# Patient Record
Sex: Female | Born: 1975 | Race: White | Hispanic: No | Marital: Single | Smoking: Current every day smoker
Health system: Southern US, Community
[De-identification: ages and names within clinical notes are randomized; demographics above are authoritative.]

## PROBLEM LIST (undated history)

## (undated) DIAGNOSIS — I10 Essential (primary) hypertension: Secondary | ICD-10-CM

## (undated) DIAGNOSIS — R87619 Unspecified abnormal cytological findings in specimens from cervix uteri: Secondary | ICD-10-CM

## (undated) DIAGNOSIS — N83202 Unspecified ovarian cyst, left side: Secondary | ICD-10-CM

## (undated) DIAGNOSIS — IMO0002 Reserved for concepts with insufficient information to code with codable children: Secondary | ICD-10-CM

## (undated) DIAGNOSIS — Z8719 Personal history of other diseases of the digestive system: Secondary | ICD-10-CM

## (undated) DIAGNOSIS — N83201 Unspecified ovarian cyst, right side: Secondary | ICD-10-CM

## (undated) DIAGNOSIS — Z8711 Personal history of peptic ulcer disease: Secondary | ICD-10-CM

## (undated) DIAGNOSIS — F319 Bipolar disorder, unspecified: Secondary | ICD-10-CM

## (undated) HISTORY — PX: TUBAL LIGATION: SHX77

## (undated) HISTORY — PX: CHOLECYSTECTOMY: SHX55

---

## 2007-04-07 ENCOUNTER — Emergency Department (HOSPITAL_COMMUNITY): Admission: EM | Admit: 2007-04-07 | Discharge: 2007-04-07 | Payer: Self-pay | Admitting: Emergency Medicine

## 2010-12-19 LAB — I-STAT 8, (EC8 V) (CONVERTED LAB)
Acid-base deficit: 1
TCO2: 23
pCO2, Ven: 30.5 — ABNORMAL LOW
pH, Ven: 7.468 — ABNORMAL HIGH

## 2010-12-19 LAB — DIFFERENTIAL
Basophils Absolute: 0
Eosinophils Absolute: 1.1 — ABNORMAL HIGH
Lymphocytes Relative: 20
Neutrophils Relative %: 67

## 2010-12-19 LAB — WOUND CULTURE: Gram Stain: NONE SEEN

## 2010-12-19 LAB — POCT I-STAT CREATININE
Creatinine, Ser: 0.7
Operator id: 257131

## 2010-12-19 LAB — CBC
MCHC: 33.6
RDW: 13.2

## 2011-08-20 ENCOUNTER — Encounter (HOSPITAL_COMMUNITY): Payer: Self-pay | Admitting: Emergency Medicine

## 2011-08-20 ENCOUNTER — Emergency Department (HOSPITAL_COMMUNITY)
Admission: EM | Admit: 2011-08-20 | Discharge: 2011-08-20 | Disposition: A | Discharge status: Home / Self Care | Payer: Self-pay | Attending: Emergency Medicine | Admitting: Emergency Medicine

## 2011-08-20 DIAGNOSIS — IMO0002 Reserved for concepts with insufficient information to code with codable children: Secondary | ICD-10-CM | POA: Insufficient documentation

## 2011-08-20 DIAGNOSIS — F17200 Nicotine dependence, unspecified, uncomplicated: Secondary | ICD-10-CM | POA: Insufficient documentation

## 2011-08-20 DIAGNOSIS — L0291 Cutaneous abscess, unspecified: Secondary | ICD-10-CM

## 2011-08-20 DIAGNOSIS — I10 Essential (primary) hypertension: Secondary | ICD-10-CM | POA: Insufficient documentation

## 2011-08-20 HISTORY — DX: Essential (primary) hypertension: I10

## 2011-08-20 MED ORDER — SULFAMETHOXAZOLE-TRIMETHOPRIM 800-160 MG PO TABS: 1.0000 | ORAL_TABLET | Freq: Two times a day (BID) | ORAL | Status: AC

## 2011-08-20 MED ORDER — HYDROCODONE-ACETAMINOPHEN 5-325 MG PO TABS: 1.0000 | ORAL_TABLET | ORAL | Status: AC | PRN

## 2011-08-20 MED ORDER — OXYCODONE-ACETAMINOPHEN 5-325 MG PO TABS
1.0000 | ORAL_TABLET | Freq: Once | ORAL | Status: AC
  Administered 2011-08-20: 1 via ORAL
  Filled 2011-08-20: qty 1

## 2011-08-20 NOTE — ED Notes (Signed)
Patient is AOx4 and comfortable with her discharge instructions.  Patient has a ride home. 

## 2011-08-20 NOTE — ED Provider Notes (Signed)
History     CSN: [id number]  Arrival date & time 08/20/11  6440   First MD Initiated Contact with Patient 08/20/11 2005      Chief Complaint  Patient presents with  . Recurrent Skin Infections  . Shoulder Pain    (Consider location/radiation/quality/duration/timing/severity/associated sxs/prior treatment) Patient is a 36 y.o. female presenting with abscess. The history is provided by the patient.  Abscess  This is a new problem. The current episode started yesterday. The problem has been gradually worsening. Pertinent negatives include no fever. Associated symptoms comments: Painful swelling posterior right shoulder since yesterday. Some drainage this morning. No fever. No history of abscesses in the past..    Past Medical History  Diagnosis Date  . Hypertension     History reviewed. No pertinent past surgical history.  History reviewed. No pertinent family history.  History  Substance Use Topics  . Smoking status: Current Everyday Smoker -- 1.0 packs/day  . Smokeless tobacco: Not on file  . Alcohol Use: No    OB History    Grav Para Term Preterm Abortions TAB SAB Ect Mult Living                  Review of Systems  Constitutional: Negative for fever.  Gastrointestinal: Negative for nausea.  Skin:       See HPI.  Neurological: Negative.     Allergies  Review of patient's allergies indicates no known allergies.  Home Medications  No current outpatient prescriptions on file.  BP 143/106  Pulse 106  Temp(Src) 98.8 F (37.1 C) (Oral)  Resp 20  SpO2 99%  LMP 08/18/2011  Physical Exam  Constitutional: She appears well-developed and well-nourished.  Pulmonary/Chest: Effort normal.  Skin:       Swollen area to posterior right shoulder that is tender with scab present. No active drainage. Minimal induration and no fluctuance.     ED Course  Procedures (including critical care time)  Labs Reviewed - No data to display No results found. INCISION AND  DRAINAGE Performed by: Langley Adie A Consent: Verbal consent obtained. Risks and benefits: risks, benefits and alternatives were discussed Type: abscess  Body area: post. Rt. shoulder  Anesthesia: local infiltration  Local anesthetic: lidocaine 1% w/ epinephrine  Anesthetic total: 1 ml  Complexity: complex Blunt dissection to break up loculations  Drainage: purulent  Drainage amount: small  Packing material: 1/4 in iodoform gauze  Patient tolerance: Patient tolerated the procedure well with no immediate complications.     No diagnosis found. 1. Abscess    MDM  Suspect sebaceous cyst abscess with minimal drainage and small cavity. No packing used. Abx and pain medication given.         Rodena Medin, PA-C 08/20/11 2056

## 2011-08-20 NOTE — ED Provider Notes (Signed)
Medical screening examination/treatment/procedure(s) were performed by non-physician practitioner and as supervising physician I was immediately available for consultation/collaboration.   Eligah Anello, MD 08/20/11 2318 

## 2011-08-20 NOTE — ED Notes (Signed)
Patient complaining of a boil on her right shoulder that started yesterday afternoon; patient states that the boil causes pain all the way down her right arm, her neck, and her back.  Patient able to move all extremities without difficulty.  Scab noted over boil.

## 2011-08-20 NOTE — Discharge Instructions (Signed)
Abscess An abscess (boil or furuncle) is an infected area that contains a collection of pus.  SYMPTOMS Signs and symptoms of an abscess include pain, tenderness, redness, or hardness. You may feel a moveable soft area under your skin. An abscess can occur anywhere in the body.  TREATMENT  A surgical cut (incision) may be made over your abscess to drain the pus. Gauze may be packed into the space or a drain may be looped through the abscess cavity (pocket). This provides a drain that will allow the cavity to heal from the inside outwards. The abscess may be painful for a few days, but should feel much better if it was drained.  Your abscess, if seen early, may not have localized and may not have been drained. If not, another appointment may be required if it does not get better on its own or with medications. HOME CARE INSTRUCTIONS   Only take over-the-counter or prescription medicines for pain, discomfort, or fever as directed by your caregiver.   Take your antibiotics as directed if they were prescribed. Finish them even if you start to feel better.   Keep the skin and clothes clean around your abscess.   If the abscess was drained, you will need to use gauze dressing to collect any draining pus. Dressings will typically need to be changed 3 or more times a day.   The infection may spread by skin contact with others. Avoid skin contact as much as possible.   Practice good hygiene. This includes regular hand washing, cover any draining skin lesions, and do not share personal care items.   If you participate in sports, do not share athletic equipment, towels, whirlpools, or personal care items. Shower after every practice or tournament.   If a draining area cannot be adequately covered:   Do not participate in sports.   Children should not participate in day care until the wound has healed or drainage stops.   If your caregiver has given you a follow-up appointment, it is very important  to keep that appointment. Not keeping the appointment could result in a much worse infection, chronic or permanent injury, pain, and disability. If there is any problem keeping the appointment, you must call back to this facility for assistance.  SEEK MEDICAL CARE IF:   You develop increased pain, swelling, redness, drainage, or bleeding in the wound site.   You develop signs of generalized infection including muscle aches, chills, fever, or a general ill feeling.   You have an oral temperature above 102 F (38.9 C).  MAKE SURE YOU:   Understand these instructions.   Will watch your condition.   Will get help right away if you are not doing well or get worse.  Document Released: 12/25/2004 Document Revised: 03/06/2011 Document Reviewed: 10/19/2007 [facility] Patient Information 2012 [place], [place].  RETURN IN 2 DAYS FOR RECHECK IF THERE IS ANY FURTHER PAIN, REDNESS, DRAINAGE OR SOONER IF YOU DEVELOP FEVER, SEVERE PAIN OR NEW CONCERN. TAKE MEDICATIONS AS PRESCRIBED.

## 2011-10-18 ENCOUNTER — Inpatient Hospital Stay (HOSPITAL_COMMUNITY)
Admission: AD | Admit: 2011-10-18 | Discharge: 2011-10-18 | Disposition: A | Discharge status: Home / Self Care | Payer: Self-pay | Source: Ambulatory Visit | Attending: Obstetrics & Gynecology | Admitting: Obstetrics & Gynecology

## 2011-10-18 ENCOUNTER — Inpatient Hospital Stay (HOSPITAL_COMMUNITY): Payer: Self-pay

## 2011-10-18 ENCOUNTER — Encounter (HOSPITAL_COMMUNITY): Payer: Self-pay | Admitting: *Deleted

## 2011-10-18 DIAGNOSIS — N760 Acute vaginitis: Secondary | ICD-10-CM | POA: Insufficient documentation

## 2011-10-18 DIAGNOSIS — B9689 Other specified bacterial agents as the cause of diseases classified elsewhere: Secondary | ICD-10-CM | POA: Insufficient documentation

## 2011-10-18 DIAGNOSIS — A499 Bacterial infection, unspecified: Secondary | ICD-10-CM | POA: Insufficient documentation

## 2011-10-18 DIAGNOSIS — I10 Essential (primary) hypertension: Secondary | ICD-10-CM | POA: Insufficient documentation

## 2011-10-18 DIAGNOSIS — R109 Unspecified abdominal pain: Secondary | ICD-10-CM | POA: Insufficient documentation

## 2011-10-18 DIAGNOSIS — N83209 Unspecified ovarian cyst, unspecified side: Secondary | ICD-10-CM | POA: Insufficient documentation

## 2011-10-18 DIAGNOSIS — N949 Unspecified condition associated with female genital organs and menstrual cycle: Secondary | ICD-10-CM | POA: Insufficient documentation

## 2011-10-18 DIAGNOSIS — N83299 Other ovarian cyst, unspecified side: Secondary | ICD-10-CM

## 2011-10-18 HISTORY — DX: Reserved for concepts with insufficient information to code with codable children: IMO0002

## 2011-10-18 HISTORY — DX: Unspecified abnormal cytological findings in specimens from cervix uteri: R87.619

## 2011-10-18 LAB — CBC WITH DIFFERENTIAL/PLATELET
Basophils Absolute: 0 10*3/uL (ref 0.0–0.1)
Basophils Relative: 0 % (ref 0–1)
Eosinophils Absolute: 0.2 10*3/uL (ref 0.0–0.7)
Eosinophils Relative: 2 % (ref 0–5)
HCT: 41 % (ref 36.0–46.0)
MCH: 28.6 pg (ref 26.0–34.0)
MCHC: 34.1 g/dL (ref 30.0–36.0)
MCV: 83.8 fL (ref 78.0–100.0)
Monocytes Absolute: 0.5 10*3/uL (ref 0.1–1.0)
Neutro Abs: 5.6 10*3/uL (ref 1.7–7.7)
RDW: 13.5 % (ref 11.5–15.5)

## 2011-10-18 LAB — WET PREP, GENITAL: Yeast Wet Prep HPF POC: NONE SEEN

## 2011-10-18 LAB — URINALYSIS, ROUTINE W REFLEX MICROSCOPIC
Bilirubin Urine: NEGATIVE
Hgb urine dipstick: NEGATIVE
Nitrite: NEGATIVE
Specific Gravity, Urine: >1.03 — ABNORMAL HIGH (ref 1.005–1.030)
pH: 6 (ref 5.0–8.0)

## 2011-10-18 LAB — POCT PREGNANCY, URINE: Preg Test, Ur: NEGATIVE

## 2011-10-18 MED ORDER — KETOROLAC TROMETHAMINE 60 MG/2ML IM SOLN
60.0000 mg | Freq: Once | INTRAMUSCULAR | Status: AC
  Administered 2011-10-18: 60 mg via INTRAMUSCULAR
  Filled 2011-10-18: qty 2

## 2011-10-18 MED ORDER — HYDROCODONE-ACETAMINOPHEN 5-325 MG PO TABS: 2.0000 | ORAL_TABLET | ORAL | Status: AC | PRN

## 2011-10-18 MED ORDER — METRONIDAZOLE 500 MG PO TABS: 500.0000 mg | ORAL_TABLET | Freq: Two times a day (BID) | ORAL | Status: AC

## 2011-10-18 NOTE — MAU Provider Note (Signed)
Attestation of Attending Supervision of Advanced Practitioner (CNM/NP): Evaluation and management procedures were performed by the Advanced Practitioner under my supervision and collaboration.  I have reviewed the Advanced Practitioner's note and chart, and I agree with the management and plan.  HARRAWAY-SMITH, Jamera Vanloan 6:43 PM     

## 2011-10-18 NOTE — MAU Note (Signed)
Patient states she might be pregnant. Has been having stomach pain all over for the last couple of weeks. Very sick this morning. Has not taken a HPT

## 2011-10-18 NOTE — MAU Provider Note (Signed)
History     CSN: [id number]  Arrival date and time: 10/18/11 1634   None     Chief Complaint  Patient presents with  . Abdominal Pain  . Possible Pregnancy   HPI Casey Adams is a 36 y.o. female who presents to MAU for abdominal pain. The pain started yesterday. She describes the pain as sharp and comes and goes. LMP 09/23/11 and lasted 2 days. Rates her pain as 8/10.  BTL for birth control over 10 years ago but had nausea this morning and thought may be pregnant.  Current sex partner x 4 years. Hx of HSV and HPV. [place] pap smear [place] 2010 and was abnormal. Did not go for follow up.  Hx of hypertension but states she can not afford the medication so not taking any. The history was provided by the patient  OB History    Grav Para Term Preterm Abortions TAB SAB Ect Mult Living   4 4 3 1      4       Past Medical History  Diagnosis Date  . Hypertension   . Abnormal Pap smear     2010    Past Surgical History  Procedure Date  . Cesarean section   . Tubal ligation     Family History  Problem Relation Age of Onset  . Heart disease Mother   . Hypertension Mother   . Miscarriages / [place] Mother   . Heart disease Father   . Hypertension Father   . Heart disease Maternal Grandmother   . Cancer Paternal Grandmother   . Cancer Paternal Grandfather     History  Substance Use Topics  . Smoking status: Current Everyday Smoker -- 1.0 packs/day  . Smokeless tobacco: Not on file  . Alcohol Use: No    Allergies: No Known Allergies  No prescriptions prior to admission    Review of Systems  Constitutional: Negative for fever, chills and weight loss.  HENT: Negative for ear pain and sore throat.   Eyes: Negative for blurred vision, double vision and pain.  Respiratory: Positive for cough (smoker x 15 years). Negative for wheezing.   Cardiovascular: Positive for leg swelling. Negative for chest pain and palpitations.  Gastrointestinal: Positive  for heartburn, nausea and abdominal pain. Negative for diarrhea and constipation.  Genitourinary: Positive for frequency. Negative for dysuria and urgency.  Musculoskeletal: Positive for back pain.  Skin: Positive for itching and rash.  Neurological: Negative for dizziness, seizures and headaches (has headaches due to "pinched nerve in back of neck").  Endo/Heme/Allergies: Does not bruise/bleed easily.  Psychiatric/Behavioral: Negative for depression. The patient has insomnia. The patient is not nervous/anxious.    Physical Exam   Blood pressure 177/116, pulse 114, temperature 99.2 F (37.3 C), temperature source Oral, resp. rate 20, height 5\' 2"  (1.575 m), weight 183 lb (83.008 kg), last menstrual period 09/23/2011.  Physical Exam  Nursing note and vitals reviewed. Constitutional: She is oriented to person, place, and time. She appears well-developed and well-nourished. No distress.       Elevated blood pressure  HENT:  Head: Normocephalic and atraumatic.  Eyes: EOM are normal.  Neck: Neck supple.  Cardiovascular:       tachycardia  Respiratory: Effort normal.  GI: Soft. There is tenderness in the right lower quadrant, suprapubic area and left lower quadrant. There is no rigidity, no rebound, no guarding and no CVA tenderness.  Genitourinary:       External genitalia without lesions.  Frothy malodorous discharge vaginal vault. Positive CMT, bilateral adnexal tenderness.   Musculoskeletal: Normal range of motion. She exhibits no edema.  Neurological: She is alert and oriented to person, place, and time.  Skin: Skin is warm and dry. Rash noted.       Lower legs with insect bites with small areas of erythema surrounding each. Allergic local reaction, no signs of infection.  Psychiatric: She has a normal mood and affect. Her behavior is normal. Judgment and thought content normal.   Will giveToradol for pain prior to going to ultrasound.  Results for orders placed during the hospital  encounter of 10/18/11 (from the past 24 hour(s))  POCT PREGNANCY, URINE     Status: Normal   Collection Time   10/18/11  4:49 PM      Component Value Range   Preg Test, Ur NEGATIVE  NEGATIVE  URINALYSIS, ROUTINE W REFLEX MICROSCOPIC     Status: Abnormal   Collection Time   10/18/11  4:51 PM      Component Value Range   Color, Urine YELLOW  YELLOW   APPearance HAZY (*) CLEAR   Specific Gravity, Urine >1.030 (*) 1.005 - 1.030   pH 6.0  5.0 - 8.0   Glucose, UA NEGATIVE  NEGATIVE mg/dL   Hgb urine dipstick NEGATIVE  NEGATIVE   Bilirubin Urine NEGATIVE  NEGATIVE   Ketones, ur NEGATIVE  NEGATIVE mg/dL   Protein, ur NEGATIVE  NEGATIVE mg/dL   Urobilinogen, UA 0.2  0.0 - 1.0 mg/dL   Nitrite NEGATIVE  NEGATIVE   Leukocytes, UA NEGATIVE  NEGATIVE  CBC WITH DIFFERENTIAL     Status: Normal   Collection Time   10/18/11  5:09 PM      Component Value Range   WBC 8.2  4.0 - 10.5 K/uL   RBC 4.89  3.87 - 5.11 MIL/uL   Hemoglobin 14.0  12.0 - 15.0 g/dL   HCT 16.1  09.6 - 04.5 %   MCV 83.8  78.0 - 100.0 fL   MCH 28.6  26.0 - 34.0 pg   MCHC 34.1  30.0 - 36.0 g/dL   RDW 40.9  81.1 - 91.4 %   Platelets 225  150 - 400 K/uL   Neutrophils Relative 68  43 - 77 %   Neutro Abs 5.6  1.7 - 7.7 K/uL   Lymphocytes Relative 25  12 - 46 %   Lymphs Abs 2.0  0.7 - 4.0 K/uL   Monocytes Relative 6  3 - 12 %   Monocytes Absolute 0.5  0.1 - 1.0 K/uL   Eosinophils Relative 2  0 - 5 %   Eosinophils Absolute 0.2  0.0 - 0.7 K/uL   Basophils Relative 0  0 - 1 %   Basophils Absolute 0.0  0.0 - 0.1 K/uL  WET PREP, GENITAL     Status: Abnormal   Collection Time   10/18/11  5:10 PM      Component Value Range   Yeast Wet Prep HPF POC NONE SEEN  NONE SEEN   Trich, Wet Prep NONE SEEN  NONE SEEN   Clue Cells Wet Prep HPF POC MODERATE (*) NONE SEEN   WBC, Wet Prep HPF POC FEW (*) NONE SEEN    Study Result     *RADIOLOGY REPORT*  Clinical Data: Pelvic pain, dyspareunia, abnormal uterine bleeding.  Negative pregnancy  test. Status post bilateral tubal ligation.  Gravida 4, para 4. LMP 09/23/2011.  TRANSABDOMINAL AND TRANSVAGINAL ULTRASOUND OF PELVIS  Technique:  Both transabdominal and transvaginal ultrasound  examinations of the pelvis were performed. Transabdominal technique  was performed for global imaging of the pelvis including uterus,  ovaries, adnexal regions, and pelvic cul-de-sac.  It was necessary to proceed with endovaginal exam following the  transabdominal exam to visualize the the ovaries and endometrium.  Comparison: None  Findings:  Uterus: The uterus is 8.1 x 4.2 x 5.4 cm.  Endometrium: 10 mm, normal in appearance.  Right ovary: 2.9 x 2.1 x 2.4 cm.  Left ovary: 4.2 x 2.6 x 3.7 cm. Within the left ovary, there is a  cystic lesion measuring 2.9 x 2.5 x 2.8 cm and containing low level  echoes. Findings are consistent with hemorrhagic cyst.  Other findings: No free fluid.  IMPRESSION:  1. Normal-appearing uterus.  2. Normal-appearing ovaries. Left hemorrhagic cyst.  Original Report Authenticated By: Patterson Hammersmith, M.D.   18:15 pm Patient returned from ultrasound. States that the Toradol helped a lot.   Assessment: 36 y.o. female with Pelvic pain   Bacterial vaginosis   Left hemorrhagic ovarian cyst   Hypertension  Plan:  Rx Flagyl   Rx Hydrocodone   Follow up with PCP for HTN   Follow up with [facility] for pap smear ( patient to call for appointment)  Recheck BP after pain has improved = 138/91    I have reviewed this patient's vital signs, nurses notes, appropriate labs and imaging. I have discussed with the patient in detail need for PCP to follow up for HTN. Discussed use of hydrocodone or regular tylenol for pain but not tylenol in addition to hydrocodone. Discussed not using ibuprofen on a daily basis due to HTN.       Procedures    Javeion Cannedy, RN, FNP, [facility] 10/18/2011, 5:15 PM

## 2011-10-18 NOTE — MAU Note (Signed)
Tubal ligation over ten years ago. History of high blood pressure but can not afford meds.

## 2011-10-20 LAB — GC/CHLAMYDIA PROBE AMP, GENITAL: Chlamydia, DNA Probe: NEGATIVE

## 2012-10-18 ENCOUNTER — Encounter (HOSPITAL_COMMUNITY): Payer: Self-pay | Admitting: *Deleted

## 2012-10-18 DIAGNOSIS — M545 Low back pain, unspecified: Secondary | ICD-10-CM | POA: Insufficient documentation

## 2012-10-18 DIAGNOSIS — I10 Essential (primary) hypertension: Secondary | ICD-10-CM | POA: Insufficient documentation

## 2012-10-18 DIAGNOSIS — N390 Urinary tract infection, site not specified: Secondary | ICD-10-CM | POA: Insufficient documentation

## 2012-10-18 DIAGNOSIS — R51 Headache: Secondary | ICD-10-CM | POA: Insufficient documentation

## 2012-10-18 DIAGNOSIS — R112 Nausea with vomiting, unspecified: Secondary | ICD-10-CM | POA: Insufficient documentation

## 2012-10-18 DIAGNOSIS — Z8742 Personal history of other diseases of the female genital tract: Secondary | ICD-10-CM | POA: Insufficient documentation

## 2012-10-18 DIAGNOSIS — F17200 Nicotine dependence, unspecified, uncomplicated: Secondary | ICD-10-CM | POA: Insufficient documentation

## 2012-10-18 DIAGNOSIS — Z3202 Encounter for pregnancy test, result negative: Secondary | ICD-10-CM | POA: Insufficient documentation
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
## 2012-10-19 ENCOUNTER — Emergency Department (HOSPITAL_COMMUNITY)
Admission: EM | Admit: 2012-10-19 | Discharge: 2012-10-19 | Disposition: A | Discharge status: Home / Self Care | Payer: Self-pay | Attending: Emergency Medicine | Admitting: Emergency Medicine

## 2012-10-19 ENCOUNTER — Encounter (HOSPITAL_COMMUNITY): Payer: Self-pay | Admitting: Radiology

## 2012-10-19 ENCOUNTER — Emergency Department (HOSPITAL_COMMUNITY): Payer: Self-pay

## 2012-10-19 DIAGNOSIS — N390 Urinary tract infection, site not specified: Secondary | ICD-10-CM

## 2012-10-19 LAB — WET PREP, GENITAL

## 2012-10-19 LAB — URINALYSIS, ROUTINE W REFLEX MICROSCOPIC
Bilirubin Urine: NEGATIVE
Ketones, ur: NEGATIVE mg/dL
Nitrite: NEGATIVE
Protein, ur: NEGATIVE mg/dL
Urobilinogen, UA: 0.2 mg/dL (ref 0.0–1.0)

## 2012-10-19 LAB — URINE MICROSCOPIC-ADD ON

## 2012-10-19 MED ORDER — DIPHENHYDRAMINE HCL 50 MG/ML IJ SOLN
25.0000 mg | Freq: Once | INTRAMUSCULAR | Status: AC
  Administered 2012-10-19: 25 mg via INTRAVENOUS
  Filled 2012-10-19: qty 1

## 2012-10-19 MED ORDER — SODIUM CHLORIDE 0.9 % IV BOLUS (SEPSIS)
1000.0000 mL | Freq: Once | INTRAVENOUS | Status: AC
  Administered 2012-10-19: 1000 mL via INTRAVENOUS

## 2012-10-19 MED ORDER — METOCLOPRAMIDE HCL 10 MG PO TABS: 10.0000 mg | ORAL_TABLET | Freq: Four times a day (QID) | ORAL | Status: DC | PRN

## 2012-10-19 MED ORDER — METOCLOPRAMIDE HCL 5 MG/ML IJ SOLN
10.0000 mg | Freq: Once | INTRAMUSCULAR | Status: AC
  Administered 2012-10-19: 10 mg via INTRAVENOUS
  Filled 2012-10-19: qty 2

## 2012-10-19 MED ORDER — NITROFURANTOIN MONOHYD MACRO 100 MG PO CAPS: 100.0000 mg | ORAL_CAPSULE | Freq: Two times a day (BID) | ORAL | Status: DC

## 2012-10-19 MED ORDER — DEXTROSE 5 % IV SOLN
1.0000 g | Freq: Once | INTRAVENOUS | Status: AC
  Administered 2012-10-19: 1 g via INTRAVENOUS
  Filled 2012-10-19: qty 10

## 2012-10-19 NOTE — ED Provider Notes (Addendum)
History    CSN: [id number] Arrival date & time 10/18/12  2245  First MD Initiated Contact with Patient 10/19/12 0308     Chief Complaint  Patient presents with  . Headache   (Consider location/radiation/quality/duration/timing/severity/associated sxs/prior Treatment) HPI This is a 37 year old female with a crit of 40 history of a headache. The headache came on fairly abruptly. It is located in the occiput radiating forward to the face. It is moderate to severe in intensity. It has been associated with nausea and vomiting; she has not been able to keep anything on her stomach. She is also having frequent urination with low back pain. She denies pain with urination. She does complain of abnormal vaginal of her but denies discharge. She is unaware if she has had a fever. She denies any focal neurologic deficit. She has had photophobia. She has taken Advil without relief.  Past Medical History  Diagnosis Date  . Hypertension   . Abnormal Pap smear     2010   Past Surgical History  Procedure Laterality Date  . Cesarean section    . Tubal ligation     Family History  Problem Relation Age of Onset  . Heart disease Mother   . Hypertension Mother   . Miscarriages / [place] Mother   . Heart disease Father   . Hypertension Father   . Heart disease Maternal Grandmother   . Cancer Paternal Grandmother   . Cancer Paternal Grandfather    History  Substance Use Topics  . Smoking status: Current Every Day Smoker -- 1.00 packs/day  . Smokeless tobacco: Not on file  . Alcohol Use: No   OB History   Grav Para Term Preterm Abortions TAB SAB Ect Mult Living   4 4 3 1      4      Review of Systems  All other systems reviewed and are negative.    Allergies  Review of patient's allergies indicates no known allergies.  Home Medications   Current Outpatient Rx  Name  Route  Sig  Dispense  Refill  . ibuprofen (ADVIL,MOTRIN) 200 MG tablet   Oral   Take 400 mg by mouth every 6  (six) hours as needed for pain.          BP 146/100  Pulse 77  Temp(Src) 97.2 F (36.2 C) (Oral)  Resp 14  SpO2 99%  LMP 09/28/2012  Physical Exam General: Well-developed, well-nourished female in no acute distress; appearance consistent with age of record HENT: normocephalic, atraumatic; tenderness on percussion of sinuses Eyes: pupils equal round and reactive to light; extraocular muscles intact Neck: supple Heart: regular rate and rhythm; no murmurs, rubs or gallops Lungs: clear to auscultation bilaterally Abdomen: soft; nondistended; suprapubic tenderness; no masses or hepatosplenomegaly; bowel sounds present GU: No CVA tenderness; normal external genitalia; physiologic appearing vaginal discharge; vaginal bleeding; bladder tender; cervical motion tenderness; bilateral adnexal tenderness Extremities: No deformity; full range of motion; pulses normal Neurologic: Awake, alert and oriented; motor function intact in all extremities and symmetric; no facial droop Skin: Warm and dry Psychiatric: Flat affect    ED Course  Procedures (including critical care time)   MDM   Nursing notes and vitals signs, including pulse oximetry, reviewed.  Summary of this visit's results, reviewed by myself:  Labs:  Results for orders placed during the hospital encounter of 10/19/12 (from the past 24 hour(s))  URINALYSIS, ROUTINE W REFLEX MICROSCOPIC     Status: Abnormal   Collection Time  10/19/12  2:54 AM      Result Value Range   Color, Urine YELLOW  YELLOW   APPearance CLOUDY (*) CLEAR   Specific Gravity, Urine 1.020  1.005 - 1.030   pH 6.0  5.0 - 8.0   Glucose, UA NEGATIVE  NEGATIVE mg/dL   Hgb urine dipstick NEGATIVE  NEGATIVE   Bilirubin Urine NEGATIVE  NEGATIVE   Ketones, ur NEGATIVE  NEGATIVE mg/dL   Protein, ur NEGATIVE  NEGATIVE mg/dL   Urobilinogen, UA 0.2  0.0 - 1.0 mg/dL   Nitrite NEGATIVE  NEGATIVE   Leukocytes, UA SMALL (*) NEGATIVE  URINE MICROSCOPIC-ADD ON      Status: Abnormal   Collection Time    10/19/12  2:54 AM      Result Value Range   Squamous Epithelial / LPF MANY (*) RARE   WBC, UA 7-10  <3 WBC/hpf   RBC / HPF 0-2  <3 RBC/hpf   Bacteria, UA FEW (*) RARE   Urine-Other MUCOUS PRESENT    POCT PREGNANCY, URINE     Status: None   Collection Time    10/19/12  3:03 AM      Result Value Range   Preg Test, Ur NEGATIVE  NEGATIVE  WET PREP, GENITAL     Status: Abnormal   Collection Time    10/19/12  3:36 AM      Result Value Range   Yeast Wet Prep HPF POC NONE SEEN  NONE SEEN   Trich, Wet Prep NONE SEEN  NONE SEEN   Clue Cells Wet Prep HPF POC FEW (*) NONE SEEN   WBC, Wet Prep HPF POC MODERATE (*) NONE SEEN    Imaging Studies: Ct Head Wo Contrast  10/19/2012   *RADIOLOGY REPORT*  Clinical Data: Headache  CT HEAD WITHOUT CONTRAST  Technique:  Contiguous axial images were obtained from the base of the skull through the vertex without contrast.  Comparison: None.  Findings: There is no acute intracranial hemorrhage or infarct.  No extra-axial fluid collection.  CSF containing spaces are within normal limits.  There is no midline shift or mass lesion. Calvarium is intact.  Visualized paranasal sinuses and mastoid air cells are clear.  Orbits are unremarkable.  Soft tissues of the scalp are clear.  IMPRESSION: Normal head CT without acute intracranial abnormality.   Original Report Authenticated By: Rise Mu, M.D.    4:21 AM Headache improved after IV fluid bolus and IV medications. Rocephin given for urinary tract infection.   5:26 AM Drinking fluids without emesis. Ready for discharge.  Hanley Seamen, MD 10/19/12 5366  Hanley Seamen, MD 10/19/12 [phone]

## 2012-10-19 NOTE — ED Notes (Signed)
Patient presents with c/o posterior headache x 3 days. Pain radiates to front into both eyes. Has tried excedrin & ibuprofen with some relief but pain returns. Also endorses N/V and phonophobia. No photophobia, visual changes/disturbances or dizziness. Patient also c/o lower back pain x 1 day. Denies any fall or injury. Nothing makes pain better. Nothing makes pain worse. No numbness or tingling to LEs. No bowel or bladder incontinence. Reports urinary frequency, pain after urination and vaginal odor. No discharge.

## 2012-10-19 NOTE — ED Notes (Signed)
Nurse First rounds : Nurse explained delay , process and wait time to pt , denies pain , respirations unlabored.

## 2012-10-19 NOTE — ED Notes (Signed)
Patient transported to CT 

## 2012-10-20 LAB — GC/CHLAMYDIA PROBE AMP
CT Probe RNA: NEGATIVE
GC Probe RNA: NEGATIVE

## 2012-10-20 LAB — URINE CULTURE

## 2012-11-09 ENCOUNTER — Ambulatory Visit (HOSPITAL_COMMUNITY): Payer: Self-pay

## 2012-11-14 ENCOUNTER — Encounter (HOSPITAL_COMMUNITY): Payer: Self-pay | Admitting: *Deleted

## 2012-11-14 ENCOUNTER — Emergency Department (HOSPITAL_COMMUNITY)
Admission: EM | Admit: 2012-11-14 | Discharge: 2012-11-14 | Disposition: A | Discharge status: Home / Self Care | Payer: Self-pay | Attending: Emergency Medicine | Admitting: Emergency Medicine

## 2012-11-14 DIAGNOSIS — F17200 Nicotine dependence, unspecified, uncomplicated: Secondary | ICD-10-CM | POA: Insufficient documentation

## 2012-11-14 DIAGNOSIS — R42 Dizziness and giddiness: Secondary | ICD-10-CM | POA: Insufficient documentation

## 2012-11-14 DIAGNOSIS — Z8719 Personal history of other diseases of the digestive system: Secondary | ICD-10-CM | POA: Insufficient documentation

## 2012-11-14 DIAGNOSIS — R55 Syncope and collapse: Secondary | ICD-10-CM | POA: Insufficient documentation

## 2012-11-14 DIAGNOSIS — Z79899 Other long term (current) drug therapy: Secondary | ICD-10-CM | POA: Insufficient documentation

## 2012-11-14 DIAGNOSIS — I10 Essential (primary) hypertension: Secondary | ICD-10-CM | POA: Insufficient documentation

## 2012-11-14 DIAGNOSIS — Z791 Long term (current) use of non-steroidal anti-inflammatories (NSAID): Secondary | ICD-10-CM | POA: Insufficient documentation

## 2012-11-14 HISTORY — DX: Personal history of other diseases of the digestive system: Z87.19

## 2012-11-14 HISTORY — DX: Personal history of peptic ulcer disease: Z87.11

## 2012-11-14 LAB — URINE MICROSCOPIC-ADD ON

## 2012-11-14 LAB — URINALYSIS, ROUTINE W REFLEX MICROSCOPIC
Bilirubin Urine: NEGATIVE
Glucose, UA: NEGATIVE mg/dL
Hgb urine dipstick: NEGATIVE
Ketones, ur: NEGATIVE mg/dL
Nitrite: NEGATIVE
Protein, ur: NEGATIVE mg/dL
Specific Gravity, Urine: 1.021 (ref 1.005–1.030)
Urobilinogen, UA: 1 mg/dL (ref 0.0–1.0)
pH: 7 (ref 5.0–8.0)

## 2012-11-14 LAB — CBC
HCT: 37.9 % (ref 36.0–46.0)
Hemoglobin: 13 g/dL (ref 12.0–15.0)
MCH: 29.4 pg (ref 26.0–34.0)
MCHC: 34.3 g/dL (ref 30.0–36.0)
MCV: 85.7 fL (ref 78.0–100.0)
Platelets: 245 K/uL (ref 150–400)
RBC: 4.42 MIL/uL (ref 3.87–5.11)
RDW: 13.1 % (ref 11.5–15.5)
WBC: 10.7 K/uL — ABNORMAL HIGH (ref 4.0–10.5)

## 2012-11-14 LAB — GLUCOSE, CAPILLARY: Glucose-Capillary: 155 mg/dL — ABNORMAL HIGH (ref 70–99)

## 2012-11-14 LAB — BASIC METABOLIC PANEL
BUN: 20 mg/dL (ref 6–23)
Calcium: 9.3 mg/dL (ref 8.4–10.5)
Chloride: 103 mEq/L (ref 96–112)
Glucose, Bld: 146 mg/dL — ABNORMAL HIGH (ref 70–99)

## 2012-11-14 MED ORDER — SODIUM CHLORIDE 0.9 % IV BOLUS (SEPSIS)
1000.0000 mL | Freq: Once | INTRAVENOUS | Status: AC
  Administered 2012-11-14: 1000 mL via INTRAVENOUS

## 2012-11-14 NOTE — ED Provider Notes (Signed)
CSN: [id number]     Arrival date & time 11/14/12  1426 History     First MD Initiated Contact with Patient 11/14/12 1458     Chief Complaint  Patient presents with  . Loss of Consciousness   (Consider location/radiation/quality/duration/timing/severity/associated sxs/prior Treatment) Patient is a 37 y.o. female presenting with syncope. The history is provided by the patient and the spouse.  Loss of Consciousness Episode history:  Single Most recent episode:  Today Duration:  1 minute Timing: once. Progression:  Resolved Chronicity:  Recurrent Context: standing up   Witnessed: yes   Associated symptoms: dizziness   Associated symptoms: no chest pain, no difficulty breathing, no fever, no headaches, no recent injury, no rectal bleeding, no shortness of breath, no vomiting and no weakness     Past Medical History  Diagnosis Date  . Hypertension   . Abnormal Pap smear     2010  . History of stomach ulcers    Past Surgical History  Procedure Laterality Date  . Cesarean section    . Tubal ligation     Family History  Problem Relation Age of Onset  . Heart disease Mother   . Hypertension Mother   . Miscarriages / [place] Mother   . Heart disease Father   . Hypertension Father   . Heart disease Maternal Grandmother   . Cancer Paternal Grandmother   . Cancer Paternal Grandfather    History  Substance Use Topics  . Smoking status: Current Every Day Smoker -- 1.00 packs/day  . Smokeless tobacco: Not on file  . Alcohol Use: No   OB History   Grav Para Term Preterm Abortions TAB SAB Ect Mult Living   4 4 3 1      4      Review of Systems  Constitutional: Negative for fever and chills.  Respiratory: Negative for shortness of breath.   Cardiovascular: Positive for syncope. Negative for chest pain.  Gastrointestinal: Negative for vomiting, abdominal pain and blood in stool.  Genitourinary: Negative for dysuria, vaginal bleeding and menstrual problem.  Neurological:  Positive for dizziness and light-headedness. Negative for weakness, numbness and headaches.  All other systems reviewed and are negative.    Allergies  Other  Home Medications   Current Outpatient Rx  Name  Route  Sig  Dispense  Refill  . hydrochlorothiazide (HYDRODIURIL) 25 MG tablet   Oral   Take 25 mg by mouth daily.         Marland Kitchen ibuprofen (ADVIL,MOTRIN) 800 MG tablet   Oral   Take 800 mg by mouth every 8 (eight) hours as needed for pain.         . meloxicam (MOBIC) 15 MG tablet   Oral   Take 15 mg by mouth daily.         Marland Kitchen omeprazole (PRILOSEC) 20 MG capsule   Oral   Take 20 mg by mouth daily.          BP 117/87  Pulse 92  Temp(Src) 98 F (36.7 C) (Oral)  Resp 24  Ht 5\' 2"  (1.575 m)  Wt 170 lb (77.111 kg)  BMI 31.09 kg/m2  SpO2 94%  LMP 11/07/2012 Physical Exam  Nursing note and vitals reviewed. Constitutional: She is oriented to person, place, and time. She appears well-developed and well-nourished.  HENT:  Head: Normocephalic and atraumatic.  Right Ear: External ear normal.  Left Ear: External ear normal.  Nose: Nose normal.  Eyes: EOM are normal. Pupils are equal, round, and reactive  to light. Right eye exhibits no discharge. Left eye exhibits no discharge.  Cardiovascular: Normal rate, regular rhythm and normal heart sounds.   Pulmonary/Chest: Effort normal and breath sounds normal.  Abdominal: Soft. There is no tenderness.  Neurological: She is alert and oriented to person, place, and time. She has normal strength and normal reflexes. No cranial nerve deficit or sensory deficit. She exhibits normal muscle tone. Coordination normal. GCS eye subscore is 4. GCS verbal subscore is 5. GCS motor subscore is 6.  Skin: Skin is warm and dry.    ED Course   Procedures (including critical care time)  Labs Reviewed  CBC - Abnormal; Notable for the following:    WBC 10.7 (*)    All other components within normal limits  BASIC METABOLIC PANEL - Abnormal;  Notable for the following:    Glucose, Bld 146 (*)    GFR calc non Af Amer 88 (*)    All other components within normal limits  URINALYSIS, ROUTINE W REFLEX MICROSCOPIC - Abnormal; Notable for the following:    APPearance CLOUDY (*)    Leukocytes, UA SMALL (*)    All other components within normal limits  GLUCOSE, CAPILLARY - Abnormal; Notable for the following:    Glucose-Capillary 155 (*)    All other components within normal limits  URINE MICROSCOPIC-ADD ON - Abnormal; Notable for the following:    Squamous Epithelial / LPF FEW (*)    All other components within normal limits    Date: 11/14/2012  Rate: 81  Rhythm: normal sinus rhythm  QRS Axis: normal  Intervals: normal  ST/T Wave abnormalities: normal  Conduction Disutrbances:2 PVCs  Narrative Interpretation: PVCs but otherwise normal ECG  Old EKG Reviewed: none available   No results found. 1. Syncope     MDM  37 year old female with recurrent syncope. She was standing up outside and felt lightheaded and dizzy and passed out. No injuries she was caught by her significant other. Denies chest pain, shortness of breath, or headaches. She started to develop a headache after getting the IV which is is normal per her. Exam normal here. Symptomatic orthostatics, given fluids and feels back to normal. EKG benign, as well as basic labs. No urinary symptoms, and with no abd tenderness there is low concern for ectopic. Will d/c with outpatient f/u for outpatient syncope w/u as this is a recurrent event.   Audree Camel, MD 11/15/12 [phone]

## 2012-11-14 NOTE — ED Notes (Signed)
EMS called for syncopal episode.  She states she was walking, felt dizzy and passed out.  She was caught by her husband.  Husband states she was out for 1 min approx.  No incontinence per ems.  Initial pressure was 100/60 lying and 90/50 while sitting.  C/o dizziness when raised to sitting position.  Improved with 250 ns per ems.  Denies chest pain sob.  States was walking a lot today.

## 2013-02-14 ENCOUNTER — Ambulatory Visit: Payer: Self-pay

## 2013-09-14 ENCOUNTER — Emergency Department (HOSPITAL_COMMUNITY)
Admission: EM | Admit: 2013-09-14 | Discharge: 2013-09-14 | Disposition: A | Discharge status: Home / Self Care | Payer: Medicaid Other | Attending: Emergency Medicine | Admitting: Emergency Medicine

## 2013-09-14 ENCOUNTER — Encounter (HOSPITAL_COMMUNITY): Payer: Self-pay | Admitting: Emergency Medicine

## 2013-09-14 ENCOUNTER — Emergency Department (HOSPITAL_COMMUNITY): Payer: Medicaid Other

## 2013-09-14 DIAGNOSIS — Z8659 Personal history of other mental and behavioral disorders: Secondary | ICD-10-CM | POA: Insufficient documentation

## 2013-09-14 DIAGNOSIS — M79609 Pain in unspecified limb: Secondary | ICD-10-CM | POA: Diagnosis present

## 2013-09-14 DIAGNOSIS — Z791 Long term (current) use of non-steroidal anti-inflammatories (NSAID): Secondary | ICD-10-CM | POA: Diagnosis not present

## 2013-09-14 DIAGNOSIS — Z79899 Other long term (current) drug therapy: Secondary | ICD-10-CM | POA: Diagnosis not present

## 2013-09-14 DIAGNOSIS — F17200 Nicotine dependence, unspecified, uncomplicated: Secondary | ICD-10-CM | POA: Insufficient documentation

## 2013-09-14 DIAGNOSIS — M79604 Pain in right leg: Secondary | ICD-10-CM

## 2013-09-14 DIAGNOSIS — I10 Essential (primary) hypertension: Secondary | ICD-10-CM | POA: Insufficient documentation

## 2013-09-14 DIAGNOSIS — K279 Peptic ulcer, site unspecified, unspecified as acute or chronic, without hemorrhage or perforation: Secondary | ICD-10-CM | POA: Insufficient documentation

## 2013-09-14 DIAGNOSIS — M25469 Effusion, unspecified knee: Secondary | ICD-10-CM | POA: Insufficient documentation

## 2013-09-14 HISTORY — DX: Bipolar disorder, unspecified: F31.9

## 2013-09-14 MED ORDER — TRAMADOL HCL 50 MG PO TABS
50.0000 mg | ORAL_TABLET | Freq: Once | ORAL | Status: AC
  Administered 2013-09-14: 50 mg via ORAL
  Filled 2013-09-14: qty 1

## 2013-09-14 MED ORDER — MELOXICAM 15 MG PO TABS: 15.0000 mg | ORAL_TABLET | Freq: Every day | ORAL | Status: DC

## 2013-09-14 MED ORDER — TRAMADOL HCL 50 MG PO TABS: 50.0000 mg | ORAL_TABLET | Freq: Four times a day (QID) | ORAL | Status: DC | PRN

## 2013-09-14 NOTE — Progress Notes (Signed)
*  Preliminary Results* Right lower extremity venous duplex completed. Right lower extremity is negative for deep vein thrombosis. There is no evidence of right Baker's cyst.  09/14/2013 1:20 PM  Gertie FeyMichelle Simonetti, RVT, RDCS, RDMS

## 2013-09-14 NOTE — ED Notes (Signed)
Patient refuses wheelchair at this time.

## 2013-09-14 NOTE — ED Provider Notes (Signed)
CSN: [id number]     Arrival date & time 09/14/13  1150 History  This chart was scribed for non-physician practitioner, Junius FinnerErin O'Malley, PA-C working with Shanna CiscoMegan E Docherty, MD by Greggory StallionKayla Andersen, ED scribe. This patient was seen in room TR07C/TR07C and the patient's care was started at 12:17 PM.   Chief Complaint  Patient presents with  . Leg Pain   The history is provided by the patient. No language interpreter was used.   HPI Comments: Casey Adams is a 38 y.o. female who presents to the Emergency Department complaining of gradual onset right lower leg and foot pain with associated mild swelling that started one month ago. States it has worsened over the last few days. Pt states she saw her PCP one week ago for the same and was put on lasix with no relief. Pain in right lower leg is aching and cramping, 8/10. Nothing makes better. Denies known injury. She thinks it has gotten worse since starting the lasix. Bearing weight worsens the pain. Pt has not taken any other medications for pain. Denies fever, nausea, emesis, left leg pain. Denies history of DVT or PE. Denies recent long distance travel. Pt is not currently on birth control or estrogen pills.   [facility]  Past Medical History  Diagnosis Date  . Hypertension   . Abnormal Pap smear     2010  . History of stomach ulcers   . Bipolar 1 disorder    Past Surgical History  Procedure Laterality Date  . Cesarean section    . Tubal ligation     Family History  Problem Relation Age of Onset  . Heart disease Mother   . Hypertension Mother   . Miscarriages / [place] Mother   . Heart disease Father   . Hypertension Father   . Heart disease Maternal Grandmother   . Cancer Paternal Grandmother   . Cancer Paternal Grandfather    History  Substance Use Topics  . Smoking status: Current Every Day Smoker -- 1.00 packs/day  . Smokeless tobacco: Not on file  . Alcohol Use: No   OB History   Grav Para Term  Preterm Abortions TAB SAB Ect Mult Living   4 4 3 1      4      Review of Systems  Constitutional: Negative for fever.  Gastrointestinal: Negative for nausea and vomiting.  Musculoskeletal: Positive for arthralgias, joint swelling and myalgias.  All other systems reviewed and are negative.  Allergies  Other  Home Medications   Prior to Admission medications   Medication Sig Start Date End Date Taking? Authorizing Provider  hydrochlorothiazide (HYDRODIURIL) 25 MG tablet Take 25 mg by mouth daily.    Historical Provider, MD  ibuprofen (ADVIL,MOTRIN) 800 MG tablet Take 800 mg by mouth every 8 (eight) hours as needed for pain.    Historical Provider, MD  meloxicam (MOBIC) 15 MG tablet Take 1 tablet (15 mg total) by mouth daily. Take daily for 7 days, then daily as needed for pain. 09/14/13   Junius FinnerErin O'Malley, PA-C  omeprazole (PRILOSEC) 20 MG capsule Take 20 mg by mouth daily.    Historical Provider, MD  traMADol (ULTRAM) 50 MG tablet Take 1 tablet (50 mg total) by mouth every 6 (six) hours as needed. 09/14/13   Junius FinnerErin O'Malley, PA-C   BP 123/83  Pulse 74  Temp(Src) 98.3 F (36.8 C) (Oral)  Resp 18  SpO2 99%  LMP 09/10/2013  Physical Exam  Nursing note  and vitals reviewed. Constitutional: She is oriented to person, place, and time. She appears well-developed and well-nourished.  HENT:  Head: Normocephalic and atraumatic.  Eyes: EOM are normal.  Neck: Normal range of motion.  Cardiovascular: Normal rate, regular rhythm and normal heart sounds.   Pulmonary/Chest: Effort normal and breath sounds normal. No respiratory distress. She has no wheezes. She has no rales.  Musculoskeletal: Normal range of motion. She exhibits edema.  Mild edema to right lower leg and ankle. No erythema, ecchymosis or warmth. Pedal pulse 2+. Full ROM. Strength 5/5. Sensation intact.   Neurological: She is alert and oriented to person, place, and time.  Skin: Skin is warm and dry.  Psychiatric: She has a normal  mood and affect. Her behavior is normal.   ED Course  Procedures (including critical care time)  DIAGNOSTIC STUDIES: Oxygen Saturation is 99% on RA, normal by my interpretation.    COORDINATION OF CARE: 12:19 PM-Discussed treatment plan which includes xray and ultrasound with pt at bedside and pt agreed to plan.   Labs Review Labs Reviewed - No data to display  Imaging Review Dg Ankle Complete Right  09/14/2013   CLINICAL DATA:  Right leg pain and swelling, worsening recently.  EXAM: RIGHT ANKLE - COMPLETE 3+ VIEW  COMPARISON:  None.  FINDINGS: There is no evidence of fracture, dislocation, or joint effusion. There is no evidence of arthropathy or other focal bone abnormality. Soft tissues are unremarkable.  IMPRESSION: Negative.   Electronically Signed   By: Herbie BaltimoreWalt  Liebkemann M.D.   On: 09/14/2013 13:37     EKG Interpretation None      MDM   Final diagnoses:  Right leg pain    Pt is a 37yo female c/o 51mo hx of right lower leg pain.  Pt started on Lasix by her PCP w/o relief. No evidence of CHF. Denies chest pain or SOB. Vitals: WNL. No evidence of underlying infection. Low suspicion for DVT, however due to worsening pain, will get Venous U/S as well as plain films of ankle.  Imaging: unremarkable. Will discharge pt home to f/u with PCP as needed for continued pain. Pt verbalized understanding and agreement with tx plan.   I personally performed the services described in this documentation, which was scribed in my presence. The recorded information has been reviewed and is accurate.  Junius Finnerrin O'Malley, PA-C 09/14/13 1642

## 2013-09-14 NOTE — ED Notes (Signed)
Pt c/o right lower leg and foot pain and swelling x 1 month. [facility] week [facility] for same. "He put me on Lasix" for the swelling. No known injury. Slight swelling noted.

## 2013-09-16 NOTE — ED Provider Notes (Signed)
Medical screening examination/treatment/procedure(s) were performed by non-physician practitioner and as supervising physician I was immediately available for consultation/collaboration.   Megan E Docherty, MD 09/16/13 0009 

## 2014-01-08 ENCOUNTER — Encounter (HOSPITAL_COMMUNITY): Payer: Self-pay | Admitting: Emergency Medicine

## 2014-01-08 ENCOUNTER — Inpatient Hospital Stay (HOSPITAL_COMMUNITY)
Admission: EM | Admit: 2014-01-08 | Discharge: 2014-01-10 | DRG: 343 | Disposition: A | Discharge status: Home / Self Care | Payer: Medicaid Other | Attending: General Surgery | Admitting: General Surgery

## 2014-01-08 DIAGNOSIS — F319 Bipolar disorder, unspecified: Secondary | ICD-10-CM | POA: Diagnosis present

## 2014-01-08 DIAGNOSIS — Z8249 Family history of ischemic heart disease and other diseases of the circulatory system: Secondary | ICD-10-CM

## 2014-01-08 DIAGNOSIS — I10 Essential (primary) hypertension: Secondary | ICD-10-CM | POA: Diagnosis present

## 2014-01-08 DIAGNOSIS — F17200 Nicotine dependence, unspecified, uncomplicated: Secondary | ICD-10-CM | POA: Diagnosis present

## 2014-01-08 DIAGNOSIS — K3580 Unspecified acute appendicitis: Secondary | ICD-10-CM | POA: Diagnosis present

## 2014-01-08 DIAGNOSIS — Z6837 Body mass index (BMI) 37.0-37.9, adult: Secondary | ICD-10-CM

## 2014-01-08 DIAGNOSIS — K3589 Other acute appendicitis: Principal | ICD-10-CM | POA: Diagnosis present

## 2014-01-08 LAB — COMPREHENSIVE METABOLIC PANEL
ALBUMIN: 3.7 g/dL (ref 3.5–5.2)
ALT: 32 U/L (ref 0–35)
ANION GAP: 16 — AB (ref 5–15)
AST: 24 U/L (ref 0–37)
Alkaline Phosphatase: 78 U/L (ref 39–117)
BUN: 6 mg/dL (ref 6–23)
CO2: 22 meq/L (ref 19–32)
CREATININE: 0.73 mg/dL (ref 0.50–1.10)
Calcium: 9.2 mg/dL (ref 8.4–10.5)
Chloride: 100 mEq/L (ref 96–112)
GFR calc Af Amer: >90 mL/min (ref >90)
Glucose, Bld: 107 mg/dL — ABNORMAL HIGH (ref 70–99)
POTASSIUM: 3.8 meq/L (ref 3.7–5.3)
Sodium: 138 mEq/L (ref 137–147)
Total Bilirubin: 0.3 mg/dL (ref 0.3–1.2)
Total Protein: 7.6 g/dL (ref 6.0–8.3)

## 2014-01-08 LAB — CBC WITH DIFFERENTIAL/PLATELET
BASOS ABS: 0 10*3/uL (ref 0.0–0.1)
BASOS PCT: 0 % (ref 0–1)
Eosinophils Absolute: 0.3 10*3/uL (ref 0.0–0.7)
Eosinophils Relative: 2 % (ref 0–5)
HEMATOCRIT: 39 % (ref 36.0–46.0)
Hemoglobin: 13.3 g/dL (ref 12.0–15.0)
LYMPHS PCT: 23 % (ref 12–46)
Lymphs Abs: 4 10*3/uL (ref 0.7–4.0)
MCH: 27.8 pg (ref 26.0–34.0)
MCHC: 34.1 g/dL (ref 30.0–36.0)
MCV: 81.6 fL (ref 78.0–100.0)
MONO ABS: 0.8 10*3/uL (ref 0.1–1.0)
Monocytes Relative: 5 % (ref 3–12)
NEUTROS ABS: 12.2 10*3/uL — AB (ref 1.7–7.7)
NEUTROS PCT: 70 % (ref 43–77)
PLATELETS: 305 10*3/uL (ref 150–400)
RBC: 4.78 MIL/uL (ref 3.87–5.11)
RDW: 14.4 % (ref 11.5–15.5)
WBC: 17.2 10*3/uL — AB (ref 4.0–10.5)

## 2014-01-08 LAB — LIPASE, BLOOD: Lipase: 41 U/L (ref 11–59)

## 2014-01-08 NOTE — ED Notes (Signed)
Patient presents with c/o upper abd pain after eating (history of gallbladder trouble)

## 2014-01-09 ENCOUNTER — Emergency Department (HOSPITAL_COMMUNITY): Payer: Medicaid Other

## 2014-01-09 ENCOUNTER — Encounter (HOSPITAL_COMMUNITY): Admission: EM | Disposition: A | Discharge status: Home / Self Care | Payer: Self-pay | Source: Home / Self Care

## 2014-01-09 ENCOUNTER — Encounter (HOSPITAL_COMMUNITY): Payer: Medicaid Other | Admitting: Certified Registered"

## 2014-01-09 ENCOUNTER — Encounter (HOSPITAL_COMMUNITY): Payer: Self-pay | Admitting: Radiology

## 2014-01-09 ENCOUNTER — Observation Stay (HOSPITAL_COMMUNITY): Payer: Medicaid Other | Admitting: Certified Registered"

## 2014-01-09 DIAGNOSIS — Z6837 Body mass index (BMI) 37.0-37.9, adult: Secondary | ICD-10-CM | POA: Diagnosis not present

## 2014-01-09 DIAGNOSIS — F319 Bipolar disorder, unspecified: Secondary | ICD-10-CM | POA: Diagnosis present

## 2014-01-09 DIAGNOSIS — E669 Obesity, unspecified: Secondary | ICD-10-CM | POA: Diagnosis present

## 2014-01-09 DIAGNOSIS — K3589 Other acute appendicitis: Secondary | ICD-10-CM | POA: Diagnosis present

## 2014-01-09 DIAGNOSIS — K3580 Unspecified acute appendicitis: Secondary | ICD-10-CM | POA: Diagnosis present

## 2014-01-09 DIAGNOSIS — I10 Essential (primary) hypertension: Secondary | ICD-10-CM | POA: Diagnosis present

## 2014-01-09 DIAGNOSIS — R1033 Periumbilical pain: Secondary | ICD-10-CM | POA: Diagnosis present

## 2014-01-09 DIAGNOSIS — F17210 Nicotine dependence, cigarettes, uncomplicated: Secondary | ICD-10-CM | POA: Diagnosis present

## 2014-01-09 DIAGNOSIS — Z8249 Family history of ischemic heart disease and other diseases of the circulatory system: Secondary | ICD-10-CM | POA: Diagnosis not present

## 2014-01-09 HISTORY — PX: LAPAROSCOPIC APPENDECTOMY: SHX408

## 2014-01-09 LAB — URINE MICROSCOPIC-ADD ON

## 2014-01-09 LAB — URINALYSIS, ROUTINE W REFLEX MICROSCOPIC
BILIRUBIN URINE: NEGATIVE
Glucose, UA: NEGATIVE mg/dL
HGB URINE DIPSTICK: NEGATIVE
KETONES UR: 15 mg/dL — AB
NITRITE: NEGATIVE
PH: 8 (ref 5.0–8.0)
Protein, ur: NEGATIVE mg/dL
SPECIFIC GRAVITY, URINE: 1.023 (ref 1.005–1.030)
Urobilinogen, UA: 1 mg/dL (ref 0.0–1.0)

## 2014-01-09 LAB — GLUCOSE, CAPILLARY: Glucose-Capillary: 127 mg/dL — ABNORMAL HIGH (ref 70–99)

## 2014-01-09 LAB — PREGNANCY, URINE: PREG TEST UR: NEGATIVE

## 2014-01-09 SURGERY — APPENDECTOMY, LAPAROSCOPIC
Anesthesia: General | Site: Abdomen

## 2014-01-09 MED ORDER — OXYCODONE HCL 5 MG PO TABS: 5.0000 mg | ORAL_TABLET | Freq: Once | ORAL | Status: DC | PRN

## 2014-01-09 MED ORDER — HYDROMORPHONE HCL 1 MG/ML IJ SOLN
0.5000 mg | INTRAMUSCULAR | Status: DC | PRN
  Administered 2014-01-09 – 2014-01-10 (×2): 1 mg via INTRAVENOUS
  Filled 2014-01-09: qty 1

## 2014-01-09 MED ORDER — OXYCODONE HCL 5 MG/5ML PO SOLN: 5.0000 mg | Freq: Once | ORAL | Status: DC | PRN

## 2014-01-09 MED ORDER — BUPIVACAINE-EPINEPHRINE 0.25% -1:200000 IJ SOLN
INTRAMUSCULAR | Status: DC | PRN
  Administered 2014-01-09: 10 mL

## 2014-01-09 MED ORDER — LACTATED RINGERS IV SOLN
INTRAVENOUS | Status: DC
  Administered 2014-01-09: 11:00:00 via INTRAVENOUS

## 2014-01-09 MED ORDER — ONDANSETRON HCL 4 MG/2ML IJ SOLN: 4.0000 mg | Freq: Four times a day (QID) | INTRAMUSCULAR | Status: DC | PRN

## 2014-01-09 MED ORDER — PHENYLEPHRINE HCL 10 MG/ML IJ SOLN
INTRAMUSCULAR | Status: DC | PRN
  Administered 2014-01-09: 120 ug via INTRAVENOUS
  Administered 2014-01-09 (×2): 80 ug via INTRAVENOUS
  Administered 2014-01-09 (×2): 120 ug via INTRAVENOUS

## 2014-01-09 MED ORDER — DEXTROSE 5 % IV SOLN: 1.0000 g | Freq: Once | INTRAVENOUS | Status: DC

## 2014-01-09 MED ORDER — PROPOFOL 10 MG/ML IV BOLUS
INTRAVENOUS | Status: DC | PRN
  Administered 2014-01-09: 130 mg via INTRAVENOUS

## 2014-01-09 MED ORDER — FENTANYL CITRATE 0.05 MG/ML IJ SOLN
INTRAMUSCULAR | Status: AC
  Filled 2014-01-09: qty 5

## 2014-01-09 MED ORDER — ONDANSETRON HCL 4 MG PO TABS: 4.0000 mg | ORAL_TABLET | Freq: Four times a day (QID) | ORAL | Status: DC | PRN

## 2014-01-09 MED ORDER — FENTANYL CITRATE 0.05 MG/ML IJ SOLN
INTRAMUSCULAR | Status: DC | PRN
Start: 2014-01-09 — End: 2014-01-09
  Administered 2014-01-09: 100 ug via INTRAVENOUS

## 2014-01-09 MED ORDER — NEOSTIGMINE METHYLSULFATE 10 MG/10ML IV SOLN
INTRAVENOUS | Status: AC
  Filled 2014-01-09: qty 1

## 2014-01-09 MED ORDER — ONDANSETRON HCL 4 MG/2ML IJ SOLN
4.0000 mg | Freq: Once | INTRAMUSCULAR | Status: AC
  Administered 2014-01-09: 4 mg via INTRAVENOUS
  Filled 2014-01-09: qty 2

## 2014-01-09 MED ORDER — ROCURONIUM BROMIDE 100 MG/10ML IV SOLN
INTRAVENOUS | Status: DC | PRN
  Administered 2014-01-09: 30 mg via INTRAVENOUS
  Administered 2014-01-09: 10 mg via INTRAVENOUS

## 2014-01-09 MED ORDER — PROPOFOL 10 MG/ML IV BOLUS
INTRAVENOUS | Status: AC
  Filled 2014-01-09: qty 20

## 2014-01-09 MED ORDER — POTASSIUM CHLORIDE IN NACL 20-0.9 MEQ/L-% IV SOLN
INTRAVENOUS | Status: DC
  Filled 2014-01-09 (×3): qty 1000

## 2014-01-09 MED ORDER — MORPHINE SULFATE 4 MG/ML IJ SOLN
4.0000 mg | Freq: Once | INTRAMUSCULAR | Status: AC
  Administered 2014-01-09: 4 mg via INTRAVENOUS
  Filled 2014-01-09: qty 1

## 2014-01-09 MED ORDER — 0.9 % SODIUM CHLORIDE (POUR BTL) OPTIME
TOPICAL | Status: DC | PRN
  Administered 2014-01-09: 1000 mL

## 2014-01-09 MED ORDER — HYDROMORPHONE HCL 1 MG/ML IJ SOLN: 0.2500 mg | INTRAMUSCULAR | Status: DC | PRN

## 2014-01-09 MED ORDER — GI COCKTAIL ~~LOC~~
30.0000 mL | Freq: Once | ORAL | Status: AC
  Administered 2014-01-09: 30 mL via ORAL
  Filled 2014-01-09: qty 30

## 2014-01-09 MED ORDER — ROCURONIUM BROMIDE 50 MG/5ML IV SOLN
INTRAVENOUS | Status: AC
  Filled 2014-01-09: qty 1

## 2014-01-09 MED ORDER — DEXTROSE 5 % IV SOLN
2.0000 g | INTRAVENOUS | Status: DC
  Administered 2014-01-09 – 2014-01-10 (×2): 2 g via INTRAVENOUS
  Filled 2014-01-09 (×3): qty 2

## 2014-01-09 MED ORDER — PROMETHAZINE HCL 25 MG/ML IJ SOLN: 6.2500 mg | INTRAMUSCULAR | Status: DC | PRN

## 2014-01-09 MED ORDER — BUPIVACAINE-EPINEPHRINE (PF) 0.25% -1:200000 IJ SOLN
INTRAMUSCULAR | Status: AC
  Filled 2014-01-09: qty 30

## 2014-01-09 MED ORDER — LIDOCAINE HCL (CARDIAC) 20 MG/ML IV SOLN
INTRAVENOUS | Status: DC | PRN
  Administered 2014-01-09: 60 mg via INTRAVENOUS

## 2014-01-09 MED ORDER — MIDAZOLAM HCL 2 MG/2ML IJ SOLN
INTRAMUSCULAR | Status: AC
  Filled 2014-01-09: qty 2

## 2014-01-09 MED ORDER — GLYCOPYRROLATE 0.2 MG/ML IJ SOLN
INTRAMUSCULAR | Status: AC
  Filled 2014-01-09: qty 2

## 2014-01-09 MED ORDER — HYDROMORPHONE HCL 1 MG/ML IJ SOLN
1.0000 mg | INTRAMUSCULAR | Status: DC | PRN
  Administered 2014-01-09 – 2014-01-10 (×4): 1 mg via INTRAVENOUS
  Filled 2014-01-09 (×5): qty 1

## 2014-01-09 MED ORDER — ONDANSETRON HCL 4 MG/2ML IJ SOLN
INTRAMUSCULAR | Status: AC
  Filled 2014-01-09: qty 2

## 2014-01-09 MED ORDER — ENOXAPARIN SODIUM 40 MG/0.4ML ~~LOC~~ SOLN
40.0000 mg | SUBCUTANEOUS | Status: DC
Start: 2014-01-09 — End: 2014-01-09
  Filled 2014-01-09: qty 0.4

## 2014-01-09 MED ORDER — SUCCINYLCHOLINE CHLORIDE 20 MG/ML IJ SOLN
INTRAMUSCULAR | Status: DC | PRN
  Administered 2014-01-09: 100 mg via INTRAVENOUS

## 2014-01-09 MED ORDER — KCL IN DEXTROSE-NACL 20-5-0.45 MEQ/L-%-% IV SOLN
INTRAVENOUS | Status: DC
  Administered 2014-01-09: 22:00:00 via INTRAVENOUS
  Administered 2014-01-09 (×2): 125 mL/h via INTRAVENOUS
  Filled 2014-01-09 (×6): qty 1000

## 2014-01-09 MED ORDER — IOHEXOL 300 MG/ML  SOLN
100.0000 mL | Freq: Once | INTRAMUSCULAR | Status: AC | PRN
  Administered 2014-01-09: 100 mL via INTRAVENOUS

## 2014-01-09 MED ORDER — CHLORHEXIDINE GLUCONATE 4 % EX LIQD
1.0000 "application " | Freq: Once | CUTANEOUS | Status: DC
  Filled 2014-01-09: qty 15

## 2014-01-09 MED ORDER — PANTOPRAZOLE SODIUM 40 MG IV SOLR
40.0000 mg | Freq: Every day | INTRAVENOUS | Status: DC
Start: 2014-01-09 — End: 2014-01-09
  Filled 2014-01-09: qty 40

## 2014-01-09 MED ORDER — HEPARIN SODIUM (PORCINE) 5000 UNIT/ML IJ SOLN
5000.0000 [IU] | Freq: Three times a day (TID) | INTRAMUSCULAR | Status: DC
  Administered 2014-01-10: 5000 [IU] via SUBCUTANEOUS
  Filled 2014-01-09 (×4): qty 1

## 2014-01-09 MED ORDER — ONDANSETRON HCL 4 MG/2ML IJ SOLN
INTRAMUSCULAR | Status: DC | PRN
  Administered 2014-01-09: 4 mg via INTRAVENOUS

## 2014-01-09 MED ORDER — GLYCOPYRROLATE 0.2 MG/ML IJ SOLN
INTRAMUSCULAR | Status: DC | PRN
  Administered 2014-01-09: 0.4 mg via INTRAVENOUS

## 2014-01-09 MED ORDER — METRONIDAZOLE IN NACL 5-0.79 MG/ML-% IV SOLN
500.0000 mg | Freq: Three times a day (TID) | INTRAVENOUS | Status: AC
  Administered 2014-01-09 (×2): 500 mg via INTRAVENOUS
  Filled 2014-01-09 (×2): qty 100

## 2014-01-09 MED ORDER — PANTOPRAZOLE SODIUM 40 MG PO TBEC
40.0000 mg | DELAYED_RELEASE_TABLET | Freq: Two times a day (BID) | ORAL | Status: DC
  Administered 2014-01-09 – 2014-01-10 (×2): 40 mg via ORAL
  Filled 2014-01-09 (×2): qty 1

## 2014-01-09 MED ORDER — SODIUM CHLORIDE 0.9 % IR SOLN
Status: DC | PRN
  Administered 2014-01-09: 1

## 2014-01-09 MED ORDER — PHENYLEPHRINE 40 MCG/ML (10ML) SYRINGE FOR IV PUSH (FOR BLOOD PRESSURE SUPPORT)
PREFILLED_SYRINGE | INTRAVENOUS | Status: AC
  Filled 2014-01-09: qty 20

## 2014-01-09 MED ORDER — NEOSTIGMINE METHYLSULFATE 10 MG/10ML IV SOLN
INTRAVENOUS | Status: DC | PRN
  Administered 2014-01-09: 3 mg via INTRAVENOUS

## 2014-01-09 MED ORDER — METRONIDAZOLE IN NACL 5-0.79 MG/ML-% IV SOLN
500.0000 mg | Freq: Three times a day (TID) | INTRAVENOUS | Status: DC
  Administered 2014-01-09: 500 mg via INTRAVENOUS
  Filled 2014-01-09 (×3): qty 100

## 2014-01-09 MED ORDER — OXYCODONE-ACETAMINOPHEN 5-325 MG PO TABS
1.0000 | ORAL_TABLET | ORAL | Status: DC | PRN
  Administered 2014-01-09 – 2014-01-10 (×2): 1 via ORAL
  Administered 2014-01-10: 2 via ORAL
  Filled 2014-01-09: qty 1
  Filled 2014-01-09: qty 2

## 2014-01-09 SURGICAL SUPPLY — 36 items
APPLIER CLIP ROT 10 11.4 M/L (STAPLE)
BLADE SURG ROTATE 9660 (MISCELLANEOUS) IMPLANT
CANISTER SUCTION 2500CC (MISCELLANEOUS) ×3 IMPLANT
CHLORAPREP W/TINT 26ML (MISCELLANEOUS) ×3 IMPLANT
CLIP APPLIE ROT 10 11.4 M/L (STAPLE) IMPLANT
COVER SURGICAL LIGHT HANDLE (MISCELLANEOUS) ×3 IMPLANT
CUTTER FLEX LINEAR 45M (STAPLE) ×6 IMPLANT
DERMABOND ADVANCED (GAUZE/BANDAGES/DRESSINGS) ×2
DERMABOND ADVANCED .7 DNX12 (GAUZE/BANDAGES/DRESSINGS) ×1 IMPLANT
DRAPE UTILITY 15X26 W/TAPE STR (DRAPE) ×6 IMPLANT
ELECT REM PT RETURN 9FT ADLT (ELECTROSURGICAL) ×3
ELECTRODE REM PT RTRN 9FT ADLT (ELECTROSURGICAL) ×1 IMPLANT
GLOVE EUDERMIC 7 POWDERFREE (GLOVE) ×3 IMPLANT
GOWN STRL REUS W/ TWL LRG LVL3 (GOWN DISPOSABLE) ×2 IMPLANT
GOWN STRL REUS W/ TWL XL LVL3 (GOWN DISPOSABLE) ×1 IMPLANT
GOWN STRL REUS W/TWL LRG LVL3 (GOWN DISPOSABLE) ×4
GOWN STRL REUS W/TWL XL LVL3 (GOWN DISPOSABLE) ×2
KIT BASIN OR (CUSTOM PROCEDURE TRAY) ×3 IMPLANT
KIT ROOM TURNOVER OR (KITS) ×3 IMPLANT
NS IRRIG 1000ML POUR BTL (IV SOLUTION) ×3 IMPLANT
PAD ARMBOARD 7.5X6 YLW CONV (MISCELLANEOUS) ×6 IMPLANT
POUCH SPECIMEN RETRIEVAL 10MM (ENDOMECHANICALS) ×3 IMPLANT
RELOAD 45 VASCULAR/THIN (ENDOMECHANICALS) ×3 IMPLANT
RELOAD STAPLE TA45 3.5 REG BLU (ENDOMECHANICALS) ×3 IMPLANT
SCALPEL HARMONIC ACE (MISCELLANEOUS) ×3 IMPLANT
SET IRRIG TUBING LAPAROSCOPIC (IRRIGATION / IRRIGATOR) ×3 IMPLANT
SPECIMEN JAR SMALL (MISCELLANEOUS) ×3 IMPLANT
SUT MNCRL AB 4-0 PS2 18 (SUTURE) ×6 IMPLANT
TOWEL OR 17X24 6PK STRL BLUE (TOWEL DISPOSABLE) ×3 IMPLANT
TOWEL OR 17X26 10 PK STRL BLUE (TOWEL DISPOSABLE) ×3 IMPLANT
TRAY FOLEY CATH 16FR SILVER (SET/KITS/TRAYS/PACK) ×3 IMPLANT
TRAY LAPAROSCOPIC (CUSTOM PROCEDURE TRAY) ×3 IMPLANT
TROCAR XCEL 12X100 BLDLESS (ENDOMECHANICALS) ×3 IMPLANT
TROCAR XCEL BLUNT TIP 100MML (ENDOMECHANICALS) ×3 IMPLANT
TROCAR XCEL NON-BLD 5MMX100MML (ENDOMECHANICALS) ×3 IMPLANT
TUBING INSUFFLATION (TUBING) ×3 IMPLANT

## 2014-01-09 NOTE — ED Provider Notes (Signed)
CSN: [id number]     Arrival date & time 01/08/14  2048 History   First MD Initiated Contact with Patient 01/09/14 0006     Chief Complaint  Patient presents with  . Abdominal Pain     (Consider location/radiation/quality/duration/timing/severity/associated sxs/prior Treatment) HPI  This is a 38 year old female who presents with abdominal pain. She reports onset of pain at approximately 6:30 PM. She reports it is perumbilical and radiates upwards and downward. She cared rises it as sharp. Currently her pain is 10 out of 10. It did occur following food intake. She reports nausea without vomiting. She endorses chills. Patient has a history of peptic ulcer disease and gallbladder disease. Patient denies any fevers.  Past Medical History  Diagnosis Date  . Hypertension   . Abnormal Pap smear     2010  . History of stomach ulcers   . Bipolar 1 disorder    Past Surgical History  Procedure Laterality Date  . Cesarean section    . Tubal ligation     Family History  Problem Relation Age of Onset  . Heart disease Mother   . Hypertension Mother   . Miscarriages / [place] Mother   . Heart disease Father   . Hypertension Father   . Heart disease Maternal Grandmother   . Cancer Paternal Grandmother   . Cancer Paternal Grandfather    History  Substance Use Topics  . Smoking status: Current Every Day Smoker -- 1.00 packs/day  . Smokeless tobacco: Not on file  . Alcohol Use: No   OB History   Grav Para Term Preterm Abortions TAB SAB Ect Mult Living   4 4 3 1      4      Review of Systems  Constitutional: Positive for chills. Negative for fever.  Respiratory: Negative for cough, chest tightness and shortness of breath.   Cardiovascular: Negative for chest pain.  Gastrointestinal: Positive for nausea, vomiting and abdominal pain. Negative for diarrhea and constipation.  Genitourinary: Negative for dysuria.  Musculoskeletal: Negative for back pain.  Skin: Negative for wound.   Neurological: Negative for headaches.  All other systems reviewed and are negative.     Allergies  Other  Home Medications   Prior to Admission medications   Medication Sig Start Date End Date Taking? Authorizing Provider  hydrochlorothiazide (HYDRODIURIL) 25 MG tablet Take 25 mg by mouth daily.   Yes Historical Provider, MD  propranolol (INDERAL) 40 MG tablet Take 40 mg by mouth daily.   Yes Historical Provider, MD   BP 113/71  Pulse 92  Temp(Src) 99.1 F (37.3 C) (Oral)  Resp 18  Ht 5\' 2"  (1.575 m)  Wt 206 lb 1.6 oz (93.486 kg)  BMI 37.69 kg/m2  SpO2 93%  LMP 12/29/2013 Physical Exam  Nursing note and vitals reviewed. Constitutional: She is oriented to person, place, and time. She appears well-developed and well-nourished. No distress.  HENT:  Head: Normocephalic and atraumatic.  Poor dentition  Eyes: Pupils are equal, round, and reactive to light.  Neck: Neck supple.  Cardiovascular: Normal rate, regular rhythm and normal heart sounds.   No murmur heard. Pulmonary/Chest: Effort normal and breath sounds normal. No respiratory distress. She has no wheezes.  Abdominal: Soft. Bowel sounds are normal. There is tenderness.  Diffuse tenderness to palpation tenderness in the right lower quadrant and left lower quadrant with positive Rovsing's.  Musculoskeletal: She exhibits no edema.  Neurological: She is alert and oriented to person, place, and time.  Skin: Skin is warm  and dry.  Psychiatric: She has a normal mood and affect.    ED Course  Procedures (including critical care time) Labs Review Labs Reviewed  CBC WITH DIFFERENTIAL - Abnormal; Notable for the following:    WBC 17.2 (*)    Neutro Abs 12.2 (*)    All other components within normal limits  COMPREHENSIVE METABOLIC PANEL - Abnormal; Notable for the following:    Glucose, Bld 107 (*)    Anion gap 16 (*)    All other components within normal limits  URINALYSIS, ROUTINE W REFLEX MICROSCOPIC - Abnormal;  Notable for the following:    APPearance CLOUDY (*)    Ketones, ur 15 (*)    Leukocytes, UA MODERATE (*)    All other components within normal limits  URINE MICROSCOPIC-ADD ON - Abnormal; Notable for the following:    Squamous Epithelial / LPF MANY (*)    Bacteria, UA MANY (*)    All other components within normal limits  GLUCOSE, CAPILLARY - Abnormal; Notable for the following:    Glucose-Capillary 127 (*)    All other components within normal limits  LIPASE, BLOOD  PREGNANCY, URINE    Imaging Review Ct Abdomen Pelvis W Contrast  01/09/2014   CLINICAL DATA:  Diffuse abdominal pain.  Initial encounter.  EXAM: CT ABDOMEN AND PELVIS WITH CONTRAST  TECHNIQUE: Multidetector CT imaging of the abdomen and pelvis was performed using the standard protocol following bolus administration of intravenous contrast.  CONTRAST:  100mL OMNIPAQUE IOHEXOL 300 MG/ML  SOLN  COMPARISON:  None.  FINDINGS: BODY WALL: Unremarkable.  LOWER CHEST: Unremarkable.  ABDOMEN/PELVIS:  Liver: No focal abnormality.  Possible diffuse fatty infiltration.  Biliary: No evidence of biliary obstruction or stone.  Pancreas: Unremarkable.  Spleen: Unremarkable.  Adrenals: Unremarkable.  Kidneys and ureters: There are numerous calculi throughout the right renal collecting system, measuring up to 3 mm in the lower pole. Less extensive left-sided nephrolithiasis, with punctate calcification in the lower pole. Presumed 1 cm cyst in the anterior hilar lip on the left. No hydronephrosis.  Bladder: Unremarkable.  Reproductive: Tubal ligation. The right-sided clip is dissociation from the adnexa, and present in the posterior peritoneal space.  Bowel: The appendix is mildly thickened, measuring up to 10 mm outer wall diameter. There is also mild haziness of the mesoappendix. No evidence of abscess or perforation. No bowel obstruction.  Retroperitoneum: No mass or adenopathy.  Peritoneum: No ascites or pneumoperitoneum.  Vascular: No acute  abnormality.  OSSEOUS: No acute abnormalities.  These results were called by telephone at the time of interpretation on 01/09/2014 at 2:41 am to Dr. Ross MarcusOURTNEY, Nolyn Swab , who verbally acknowledged these results.  IMPRESSION: 1. Findings suggest early appendicitis. 2. Bilateral nephrolithiasis.   Electronically Signed   By: Tiburcio PeaJonathan  Watts M.D.   On: 01/09/2014 02:44     EKG Interpretation None      MDM   Final diagnoses:  Acute appendicitis, unspecified acute appendicitis type    Patient presents with abdominal pain. Is nontoxic on exam.  She does attend diffuse tenderness to palpation most notably over the left lower and right lower part her. Exam is inconsistent with cholecystitis.  Basic labwork obtained. Patient has a white blood count of 17.2. Urine shows 21-50 white cells and many bacteria but the patient has no urinary symptoms. Given diffuse tenderness on exam, will obtain CT scan of the abdomen or highest yield. Considerations include diverticulitis, appendicitis, cholecystitis. CT scan notable for likely early appendicitis. Given white count and  tenderness, will consult general surgery.  Dr. Lindie SpruceWyatt to admit.    Shon Batonourtney F Amos Micheals, MD 01/09/14 [phone]

## 2014-01-09 NOTE — Op Note (Signed)
Patient Name:           Casey FosterRebecca Stahly   Date of Surgery:        01/09/2014  Pre op Diagnosis:      Acute appendicitis  Post op Diagnosis:    Acute appendicitis, suppurative  Procedure:                 Laparoscopic appendectomy  Surgeon:                     Angelia MouldHaywood M. Derrell LollingIngram, M.D., FACS  Assistant:                      RNFA  Operative Indications:   This is a 38 year old Caucasian female who presented to the emergency room in the middle of the night last night with nausea vomiting and abdominal pain. Past history is consistent with cesarean section, and laparoscopic tubal ligation, bipolar disorder, and history of stomach ulcers and hypertension. Examination revealed right lower quadrant tenderness, leukocytosis, and CT findings consistent with uncomplicated appendicitis.  Operative Findings:       The patient had acute suppurative appendicitis. The terminal ileum and cecum looked normal. The ascending colon, gallbladder, and liver looked normal. Uterus and adnexal structures look normal.  Procedure in Detail:          Following the induction of general endotracheal anesthesia a Foley catheter was placed, the abdomen was prepped and draped in sterile fashion, and a surgical time out was performed. 0.5% Marcaine with epinephrine was used as local infiltration anesthetic. Vertical incision was made in the midline just above the umbilicus. The fascia was incised in the midline and the abdominal cavity entered under direct vision. An 11 mm Hassan trocar was inserted and secured with a pursestring suture of 0 Vicryl. Pneumoperitoneum was created and videocamera  was inserted. A 5 mm trocar was placed the left mid abdomen and a 12 mm trocar placed in the suprapubic area.  I identified the terminal ileum, cecum and appendix. I lifted the tip of the appendix up. I divided the mesentery of the appendix in small steps with a harmonic scalpel. This was done until I could clearly see the junction of the  appendix with the cecum. I inserted a GIA stapling device and placed it across the base of the appendix. I fired the stapler but when I removed the stapler the staples were in place but the appendix had not been divided, and I theorized  that the scalpel blade did not advance through the tissues. I then brought a second new Endo GIA stapling device with a 3.5 cm staple height. In place this more proximally across the base of the appendix, this time taking a cuff of the cecum. Stapler was closed, fired and removed and the staple line looked very good. The appendix was placed in a specimen bag and removed. The operative field was copiously irrigated. Irrigation fluid was completely clear. Staple line was again inspected and it looked very good. The pneumoperitoneum was released the trocars were removed. The fascia at the umbilicus was closed 0 Vicryl suture. Skin incisions were irrigated and closed with subcuticular 4-0 Monocryl and Dermabond. The patient tolerated the procedure well was taken to PACU in stable condition. EBL 10 cc. Counts correct. Complications none.     Angelia MouldHaywood M. Derrell LollingIngram, M.D., FACS General and Minimally Invasive Surgery Breast and Colorectal Surgery  01/09/2014 12:37 PM

## 2014-01-09 NOTE — Progress Notes (Signed)
UR completed 

## 2014-01-09 NOTE — Progress Notes (Signed)
Called OR to see if Anesthesia orders - states to f/u in OR - coming to take to OR now. Hibiclens prep done.

## 2014-01-09 NOTE — H&P (Signed)
Casey Adams is an 38 y.o. female.   Chief Complaint: Abdominal pain HPI: Severe abdominal pain started about 1800 yesterday associated with nausea and vomiting.  Unrelenting, never had this before, history of cholelithiasis and acute cholecystitiss.  Past Medical History  Diagnosis Date  . Hypertension   . Abnormal Pap smear     2010  . History of stomach ulcers   . Bipolar 1 disorder     Past Surgical History  Procedure Laterality Date  . Cesarean section    . Tubal ligation      Family History  Problem Relation Age of Onset  . Heart disease Mother   . Hypertension Mother   . Miscarriages / [place] Mother   . Heart disease Father   . Hypertension Father   . Heart disease Maternal Grandmother   . Cancer Paternal Grandmother   . Cancer Paternal Grandfather    Social History:  reports that she has been smoking.  She does not have any smokeless tobacco history on file. She reports that she does not drink alcohol or use illicit drugs.  Allergies:  Allergies  Allergen Reactions  . Other Nausea And Vomiting    Celery     (Not in a hospital admission)  Results for orders placed during the hospital encounter of 01/08/14 (from the past 48 hour(s))  CBC WITH DIFFERENTIAL     Status: Abnormal   Collection Time    01/08/14  9:13 PM      Result Value Ref Range   WBC 17.2 (*) 4.0 - 10.5 K/uL   RBC 4.78  3.87 - 5.11 MIL/uL   Hemoglobin 13.3  12.0 - 15.0 g/dL   HCT 39.0  36.0 - 46.0 %   MCV 81.6  78.0 - 100.0 fL   MCH 27.8  26.0 - 34.0 pg   MCHC 34.1  30.0 - 36.0 g/dL   RDW 14.4  11.5 - 15.5 %   Platelets 305  150 - 400 K/uL   Neutrophils Relative % 70  43 - 77 %   Neutro Abs 12.2 (*) 1.7 - 7.7 K/uL   Lymphocytes Relative 23  12 - 46 %   Lymphs Abs 4.0  0.7 - 4.0 K/uL   Monocytes Relative 5  3 - 12 %   Monocytes Absolute 0.8  0.1 - 1.0 K/uL   Eosinophils Relative 2  0 - 5 %   Eosinophils Absolute 0.3  0.0 - 0.7 K/uL   Basophils Relative 0  0 - 1 %   Basophils  Absolute 0.0  0.0 - 0.1 K/uL  LIPASE, BLOOD     Status: None   Collection Time    01/08/14  9:13 PM      Result Value Ref Range   Lipase 41  11 - 59 U/L  COMPREHENSIVE METABOLIC PANEL     Status: Abnormal   Collection Time    01/08/14  9:13 PM      Result Value Ref Range   Sodium 138  137 - 147 mEq/L   Potassium 3.8  3.7 - 5.3 mEq/L   Chloride 100  96 - 112 mEq/L   CO2 22  19 - 32 mEq/L   Glucose, Bld 107 (*) 70 - 99 mg/dL   BUN 6  6 - 23 mg/dL   Creatinine, Ser 0.73  0.50 - 1.10 mg/dL   Calcium 9.2  8.4 - 10.5 mg/dL   Total Protein 7.6  6.0 - 8.3 g/dL   Albumin 3.7  3.5 -  5.2 g/dL   AST 24  0 - 37 U/L   ALT 32  0 - 35 U/L   Alkaline Phosphatase 78  39 - 117 U/L   Total Bilirubin 0.3  0.3 - 1.2 mg/dL   GFR calc non Af Amer >90  >90 mL/min   GFR calc Af Amer >90  >90 mL/min   Comment: (NOTE)     The eGFR has been calculated using the CKD EPI equation.     This calculation has not been validated in all clinical situations.     eGFR's persistently <90 mL/min signify possible Chronic Kidney     Disease.   Anion gap 16 (*) 5 - 15  PREGNANCY, URINE     Status: None   Collection Time    01/09/14 12:47 AM      Result Value Ref Range   Preg Test, Ur NEGATIVE  NEGATIVE   Comment:            THE SENSITIVITY OF THIS     METHODOLOGY IS >20 mIU/mL.  URINALYSIS, ROUTINE W REFLEX MICROSCOPIC     Status: Abnormal   Collection Time    01/09/14 12:47 AM      Result Value Ref Range   Color, Urine YELLOW  YELLOW   APPearance CLOUDY (*) CLEAR   Specific Gravity, Urine 1.023  1.005 - 1.030   pH 8.0  5.0 - 8.0   Glucose, UA NEGATIVE  NEGATIVE mg/dL   Hgb urine dipstick NEGATIVE  NEGATIVE   Bilirubin Urine NEGATIVE  NEGATIVE   Ketones, ur 15 (*) NEGATIVE mg/dL   Protein, ur NEGATIVE  NEGATIVE mg/dL   Urobilinogen, UA 1.0  0.0 - 1.0 mg/dL   Nitrite NEGATIVE  NEGATIVE   Leukocytes, UA MODERATE (*) NEGATIVE  URINE MICROSCOPIC-ADD ON     Status: Abnormal   Collection Time    01/09/14  12:47 AM      Result Value Ref Range   Squamous Epithelial / LPF MANY (*) RARE   WBC, UA 21-50  <3 WBC/hpf   RBC / HPF 0-2  <3 RBC/hpf   Bacteria, UA MANY (*) RARE   Urine-Other MUCOUS PRESENT     Ct Abdomen Pelvis W Contrast  01/09/2014   CLINICAL DATA:  Diffuse abdominal pain.  Initial encounter.  EXAM: CT ABDOMEN AND PELVIS WITH CONTRAST  TECHNIQUE: Multidetector CT imaging of the abdomen and pelvis was performed using the standard protocol following bolus administration of intravenous contrast.  CONTRAST:  190mL OMNIPAQUE IOHEXOL 300 MG/ML  SOLN  COMPARISON:  None.  FINDINGS: BODY WALL: Unremarkable.  LOWER CHEST: Unremarkable.  ABDOMEN/PELVIS:  Liver: No focal abnormality.  Possible diffuse fatty infiltration.  Biliary: No evidence of biliary obstruction or stone.  Pancreas: Unremarkable.  Spleen: Unremarkable.  Adrenals: Unremarkable.  Kidneys and ureters: There are numerous calculi throughout the right renal collecting system, measuring up to 3 mm in the lower pole. Less extensive left-sided nephrolithiasis, with punctate calcification in the lower pole. Presumed 1 cm cyst in the anterior hilar lip on the left. No hydronephrosis.  Bladder: Unremarkable.  Reproductive: Tubal ligation. The right-sided clip is dissociation from the adnexa, and present in the posterior peritoneal space.  Bowel: The appendix is mildly thickened, measuring up to 10 mm outer wall diameter. There is also mild haziness of the mesoappendix. No evidence of abscess or perforation. No bowel obstruction.  Retroperitoneum: No mass or adenopathy.  Peritoneum: No ascites or pneumoperitoneum.  Vascular: No acute abnormality.  OSSEOUS: No  acute abnormalities.  These results were called by telephone at the time of interpretation on 01/09/2014 at 2:41 am to Dr. Thayer Jew , who verbally acknowledged these results.  IMPRESSION: 1. Findings suggest early appendicitis. 2. Bilateral nephrolithiasis.   Electronically Signed   By:  Jorje Guild M.D.   On: 01/09/2014 02:44    Review of Systems  Constitutional: Negative for fever and chills.  Eyes: Negative.   Respiratory: Negative.   Cardiovascular: Negative.   Gastrointestinal: Positive for nausea and abdominal pain.  Neurological: Negative.   Psychiatric/Behavioral: Negative.   All other systems reviewed and are negative.   Blood pressure 146/87, pulse 93, temperature 99.3 F (37.4 C), temperature source Oral, resp. rate 19, height _0  (1.575 m), weight 77.111 kg (170 lb), last menstrual period 12/29/2013, SpO2 100.00%. Physical Exam  Vitals reviewed. Constitutional: She is oriented to person, place, and time. She appears well-developed and well-nourished.  Smells heavily of smoke.  HENT:  Head: Normocephalic and atraumatic.  Eyes: Conjunctivae and EOM are normal. Pupils are equal, round, and reactive to light.  Neck: Normal range of motion. Neck supple.  Cardiovascular: Normal rate, regular rhythm, normal heart sounds and intact distal pulses.   Respiratory: Effort normal and breath sounds normal.  GI: Soft. Normal appearance and bowel sounds are normal. There is generalized tenderness. There is rebound, guarding and tenderness at McBurney's point.  Musculoskeletal: Normal range of motion.  Neurological: She is alert and oriented to person, place, and time. She has normal reflexes.  Skin: Skin is warm and dry.  Psychiatric: She has a normal mood and affect. Her behavior is normal. Judgment and thought content normal.     Assessment/Plan Early acute appendicitis Admit for IV antibiotics and appendectomy within the next 12-24 hours.   Alberta Lenhard, JAY 01/09/2014, 3:39 AM

## 2014-01-09 NOTE — Anesthesia Postprocedure Evaluation (Signed)
  Anesthesia Post-op Note  Patient: Casey Adams  Procedure(s) Performed: Procedure(s): APPENDECTOMY LAPAROSCOPIC (N/A)  Patient Location: PACU  Anesthesia Type:General  Level of Consciousness: awake, alert  and oriented  Airway and Oxygen Therapy: Patient Spontanous Breathing  Post-op Pain: none  Post-op Assessment: Post-op Vital signs reviewed  Post-op Vital Signs: Reviewed  Last Vitals:  Filed Vitals:   01/09/14 1400  BP: 99/60  Pulse: 94  Temp: 37.1 C  Resp: 21    Complications: No apparent anesthesia complications

## 2014-01-09 NOTE — Progress Notes (Signed)
Subjective: Stable and alert.afebrile.heart rate 92. They signed says she hurts everywhere but mostly right lower quadrant. CT scan reviewed, consistent with early appendicitis but no signs of any perforation free air or abscess. WBC 17,200.  Objective: Vital signs in last 24 hours: Temp:  [98.2 F (36.8 C)-99.3 F (37.4 C)] 99.1 F (37.3 C) (10/12 0551) Pulse Rate:  [92-112] 92 (10/12 0551) Resp:  [18-37] 18 (10/12 0551) BP: (104-164)/(55-94) 113/71 mmHg (10/12 0551) SpO2:  [93 %-100 %] 93 % (10/12 0551) Weight:  [170 lb (77.111 kg)-206 lb 1.6 oz (93.486 kg)] 206 lb 1.6 oz (93.486 kg) (10/12 0439) Last BM Date: 01/08/14  Intake/Output from previous day:   Intake/Output this shift:    General appearance: alert. Cooperative. Oriented. Mental status normal.  Minimal distress. Cardio: regular rate and rhythm. No ectopy Abdomen: Obese. Diffusely tender but mostly in right lower quadrant. Some guarding. No mass. Not distended. Small scar below umbilicus from previous surger(BTL by history)  Lab Results:   Recent Labs  01/08/14 2113  WBC 17.2*  HGB 13.3  HCT 39.0  PLT 305   BMET  Recent Labs  01/08/14 2113  NA 138  K 3.8  CL 100  CO2 22  GLUCOSE 107*  BUN 6  CREATININE 0.73  CALCIUM 9.2   PT/INR No results found for this basename: LABPROT, INR,  in the last 72 hours ABG No results found for this basename: PHART, PCO2, PO2, HCO3,  in the last 72 hours  Studies/Results: Ct Abdomen Pelvis W Contrast  01/09/2014   CLINICAL DATA:  Diffuse abdominal pain.  Initial encounter.  EXAM: CT ABDOMEN AND PELVIS WITH CONTRAST  TECHNIQUE: Multidetector CT imaging of the abdomen and pelvis was performed using the standard protocol following bolus administration of intravenous contrast.  CONTRAST:  100mL OMNIPAQUE IOHEXOL 300 MG/ML  SOLN  COMPARISON:  None.  FINDINGS: BODY WALL: Unremarkable.  LOWER CHEST: Unremarkable.  ABDOMEN/PELVIS:  Liver: No focal abnormality.  Possible  diffuse fatty infiltration.  Biliary: No evidence of biliary obstruction or stone.  Pancreas: Unremarkable.  Spleen: Unremarkable.  Adrenals: Unremarkable.  Kidneys and ureters: There are numerous calculi throughout the right renal collecting system, measuring up to 3 mm in the lower pole. Less extensive left-sided nephrolithiasis, with punctate calcification in the lower pole. Presumed 1 cm cyst in the anterior hilar lip on the left. No hydronephrosis.  Bladder: Unremarkable.  Reproductive: Tubal ligation. The right-sided clip is dissociation from the adnexa, and present in the posterior peritoneal space.  Bowel: The appendix is mildly thickened, measuring up to 10 mm outer wall diameter. There is also mild haziness of the mesoappendix. No evidence of abscess or perforation. No bowel obstruction.  Retroperitoneum: No mass or adenopathy.  Peritoneum: No ascites or pneumoperitoneum.  Vascular: No acute abnormality.  OSSEOUS: No acute abnormalities.  These results were called by telephone at the time of interpretation on 01/09/2014 at 2:41 am to Dr. Ross MarcusOURTNEY, HORTON , who verbally acknowledged these results.  IMPRESSION: 1. Findings suggest early appendicitis. 2. Bilateral nephrolithiasis.   Electronically Signed   By: Tiburcio PeaJonathan  Watts M.D.   On: 01/09/2014 02:44    Anti-infectives: Anti-infectives   Start     Dose/Rate Route Frequency Ordered Stop   01/09/14 0400  cefTRIAXone (ROCEPHIN) 2 g in dextrose 5 % 50 mL IVPB     2 g 100 mL/hr over 30 Minutes Intravenous Every 24 hours 01/09/14 0335     01/09/14 0400  metroNIDAZOLE (FLAGYL) IVPB 500 mg  500 mg 100 mL/hr over 60 Minutes Intravenous Every 8 hours 01/09/14 0335     01/09/14 0330  cefTRIAXone (ROCEPHIN) 1 g in dextrose 5 % 50 mL IVPB  Status:  Discontinued     1 g 100 mL/hr over 30 Minutes Intravenous  Once 01/09/14 0323 01/09/14 0334      Assessment/Plan: s/p Procedure(s): APPENDECTOMY LAPAROSCOPIC  Acute appendicitis. No evidence of  perforation or abscess by CT, but she is a little bit tender diffusely. Proceed with laparoscopic appendectomy, possible open appendectomy today. Continue Rocephin and Flagyl I have discussed the indications, details, techniques, and numerous risks of the surgery with her. She's aware of the risk of bleeding, infection, conversion to open laparotomy, possibility of other diagnoses, injury to adjacent organs, wound healing problems such as hernia, and other unforeseen problems. She understands all these issues and all her questions are answered. She agrees with this plan.  Hypertension Obesity S/p csection S/p BTL Bipolar disorder H/o gastric ulcers  LOS: 1 day    Stehanie Ekstrom M 01/09/2014

## 2014-01-09 NOTE — Anesthesia Procedure Notes (Signed)
Procedure Name: Intubation Date/Time: 01/09/2014 11:33 AM Performed by: Charm BargesBUTLER, Miko Markwood R Pre-anesthesia Checklist: Patient identified, Timeout performed, Emergency Drugs available, Suction available and Patient being monitored Patient Re-evaluated:Patient Re-evaluated prior to inductionOxygen Delivery Method: Circle system utilized Preoxygenation: Pre-oxygenation with 100% oxygen Intubation Type: IV induction, Rapid sequence and Cricoid Pressure applied Laryngoscope Size: Mac and 3 Grade View: Grade I Tube type: Oral Tube size: 7.0 mm Number of attempts: 1 Airway Equipment and Method: Stylet Placement Confirmation: ETT inserted through vocal cords under direct vision,  positive ETCO2 and breath sounds checked- equal and bilateral Secured at: 22 cm Tube secured with: Tape Dental Injury: Teeth and Oropharynx as per pre-operative assessment

## 2014-01-09 NOTE — Anesthesia Preprocedure Evaluation (Signed)
Anesthesia Evaluation  Patient identified by MRN, date of birth, ID band Patient awake    Reviewed: Allergy & Precautions, H&P , NPO status , Patient's Chart, lab work & pertinent test results  Airway Mallampati: II TM Distance: >3 FB Neck ROM: Full    Dental  (+) Poor Dentition, Dental Advisory Given   Pulmonary Current Smoker,  breath sounds clear to auscultation        Cardiovascular hypertension, Rhythm:Regular Rate:Normal     Neuro/Psych Bipolar Disorder negative neurological ROS     GI/Hepatic negative GI ROS, Neg liver ROS,   Endo/Other  Morbid obesity  Renal/GU negative Renal ROS     Musculoskeletal negative musculoskeletal ROS (+)   Abdominal   Peds  Hematology negative hematology ROS (+)   Anesthesia Other Findings   Reproductive/Obstetrics                           Anesthesia Physical Anesthesia Plan  ASA: II  Anesthesia Plan: General   Post-op Pain Management:    Induction: Intravenous  Airway Management Planned: Oral ETT  Additional Equipment:   Intra-op Plan:   Post-operative Plan: Extubation in OR  Informed Consent: I have reviewed the patients History and Physical, chart, labs and discussed the procedure including the risks, benefits and alternatives for the proposed anesthesia with the patient or authorized representative who has indicated his/her understanding and acceptance.   Dental advisory given  Plan Discussed with: CRNA and Surgeon  Anesthesia Plan Comments:         Anesthesia Quick Evaluation

## 2014-01-09 NOTE — Transfer of Care (Signed)
Immediate Anesthesia Transfer of Care Note  Patient: Casey FosterRebecca Adams  Procedure(s) Performed: Procedure(s): APPENDECTOMY LAPAROSCOPIC (N/A)  Patient Location: PACU  Anesthesia Type:General  Level of Consciousness: awake  Airway & Oxygen Therapy: Patient Spontanous Breathing and Patient connected to face mask oxygen  Post-op Assessment: Report given to PACU RN, Post -op Vital signs reviewed and stable and Patient moving all extremities  Post vital signs: Reviewed and stable  Complications: No apparent anesthesia complications

## 2014-01-09 NOTE — ED Notes (Signed)
Transporting patient to new room assignment. 

## 2014-01-10 MED ORDER — ALUM & MAG HYDROXIDE-SIMETH 200-200-20 MG/5ML PO SUSP
45.0000 mL | Freq: Once | ORAL | Status: AC
  Administered 2014-01-10: 30 mL via ORAL
  Filled 2014-01-10: qty 60

## 2014-01-10 MED ORDER — OXYCODONE-ACETAMINOPHEN 5-325 MG PO TABS: 1.0000 | ORAL_TABLET | ORAL | Status: DC | PRN

## 2014-01-10 NOTE — Progress Notes (Signed)
1 Day Post-Op  Subjective: Doing well postop. Tolerating clear liquids and crackers. No nausea. Avoiding. She says she feels much better and the pain is much better. She would like to go home.  Objective: Vital signs in last 24 hours: Temp:  [98.5 F (36.9 C)-99.9 F (37.7 C)] 99.1 F (37.3 C) (10/13 0549) Pulse Rate:  [85-101] 93 (10/13 0549) Resp:  [18-31] 18 (10/12 2028) BP: (92-135)/(42-84) 105/63 mmHg (10/13 0549) SpO2:  [89 %-98 %] 92 % (10/13 0549) Last BM Date: 01/08/14  Intake/Output from previous day: 10/12 0701 - 10/13 0700 In: 1060 [P.O.:360; I.V.:700] Out: -  Intake/Output this shift:    General appearance: alert. Minimal distress. Oriented. Cooperative. Resp: clear to auscultation bilaterally GI: abdomen soft. Obese. Nondistended. Wounds look good. Appropriate tenderness.  Lab Results:  No results found for this or any previous visit (from the past 24 hour(s)).   Studies/Results: Ct Abdomen Pelvis W Contrast  01/09/2014   CLINICAL DATA:  Diffuse abdominal pain.  Initial encounter.  EXAM: CT ABDOMEN AND PELVIS WITH CONTRAST  TECHNIQUE: Multidetector CT imaging of the abdomen and pelvis was performed using the standard protocol following bolus administration of intravenous contrast.  CONTRAST:  100mL OMNIPAQUE IOHEXOL 300 MG/ML  SOLN  COMPARISON:  None.  FINDINGS: BODY WALL: Unremarkable.  LOWER CHEST: Unremarkable.  ABDOMEN/PELVIS:  Liver: No focal abnormality.  Possible diffuse fatty infiltration.  Biliary: No evidence of biliary obstruction or stone.  Pancreas: Unremarkable.  Spleen: Unremarkable.  Adrenals: Unremarkable.  Kidneys and ureters: There are numerous calculi throughout the right renal collecting system, measuring up to 3 mm in the lower pole. Less extensive left-sided nephrolithiasis, with punctate calcification in the lower pole. Presumed 1 cm cyst in the anterior hilar lip on the left. No hydronephrosis.  Bladder: Unremarkable.  Reproductive: Tubal  ligation. The right-sided clip is dissociation from the adnexa, and present in the posterior peritoneal space.  Bowel: The appendix is mildly thickened, measuring up to 10 mm outer wall diameter. There is also mild haziness of the mesoappendix. No evidence of abscess or perforation. No bowel obstruction.  Retroperitoneum: No mass or adenopathy.  Peritoneum: No ascites or pneumoperitoneum.  Vascular: No acute abnormality.  OSSEOUS: No acute abnormalities.  These results were called by telephone at the time of interpretation on 01/09/2014 at 2:41 am to Dr. Ross MarcusOURTNEY, HORTON , who verbally acknowledged these results.  IMPRESSION: 1. Findings suggest early appendicitis. 2. Bilateral nephrolithiasis.   Electronically Signed   By: Tiburcio PeaJonathan  Watts M.D.   On: 01/09/2014 02:44    . cefTRIAXone (ROCEPHIN)  IV  2 g Intravenous Q24H  . heparin  5,000 Units Subcutaneous 3 times per day  . pantoprazole  40 mg Oral BID     Assessment/Plan: s/p Procedure(s): APPENDECTOMY LAPAROSCOPIC  Acute appendicitis.  POD#1 laparoscopic appendectomy. Doing well.  Advance diet and activities Probable discharge home later today.  Hypertension  Obesity  S/p csection  S/p BTL  Bipolar disorder  H/o gastric ulcers-continue PPI's   @PROBHOSP @  LOS: 2 days    Zoeya Gramajo M 01/10/2014  . .prob

## 2014-01-10 NOTE — Discharge Summary (Signed)
Patient ID: Casey FosterRebecca Ashline MRN: [id number] DOB/AGE: [birth year] 38 y.o.  Admit date: 01/08/2014 Discharge date: 01/10/2014  Procedures: lap appy by Dr. Derrell LollingIngram on 01-09-14  Consults: None  Reason for Admission: Severe abdominal pain started about 1800 yesterday associated with nausea and vomiting. Unrelenting, never had this before, history of cholelithiasis and acute cholecystitiss.  Admission Diagnoses:  1. Acute appendicitis  Hospital Course: The patient was admitted and taken to the OR where she underwent a lap appy.  She tolerated this well.  On POD 1, she was tolerating a regular diet and her pain was well controlled.  She was stable for dc home.  Discharge Diagnoses:  Active Problems:   Acute appendicitis s/p lap appy  Discharge Medications:   Medication List         hydrochlorothiazide 25 MG tablet  Commonly known as:  HYDRODIURIL  Take 25 mg by mouth daily.     oxyCODONE-acetaminophen 5-325 MG per tablet  Commonly known as:  PERCOCET/ROXICET  Take 1-2 tablets by mouth every 4 (four) hours as needed for moderate pain.     propranolol 40 MG tablet  Commonly known as:  INDERAL  Take 40 mg by mouth daily.        Discharge Instructions:     Follow-up Information   Follow up with [facility].   Contact information:   [address]   [place] [id number] [phone]       Signed: Letha CapeOSBORNE,Courage Biglow E 01/10/2014, 2:02 PM

## 2014-01-10 NOTE — Discharge Instructions (Signed)
[facility] ______[facility] °LAPAROSCOPIC SURGERY: POST OP INSTRUCTIONS °Always review your discharge instruction sheet given to you by the facility where your surgery was performed. °IF YOU HAVE DISABILITY OR FAMILY LEAVE FORMS, YOU MUST BRING THEM TO THE OFFICE FOR PROCESSING.   °DO NOT GIVE THEM TO YOUR DOCTOR. ° °1. A prescription for pain medication may be given to you upon discharge.  Take your pain medication as prescribed, if needed.  If narcotic pain medicine is not needed, then you may take acetaminophen (Tylenol) or ibuprofen (Advil) as needed. °2. Take your usually prescribed medications unless otherwise directed. °3. If you need a refill on your pain medication, please contact your pharmacy.  They will contact our office to request authorization. Prescriptions will not be filled after 5pm or on week-ends. °4. You should follow a light diet the first few days after arrival home, such as soup and crackers, etc.  Be sure to include lots of fluids daily. °5. Most patients will experience some swelling and bruising in the area of the incisions.  Ice packs will help.  Swelling and bruising can take several days to resolve.  °6. It is common to experience some constipation if taking pain medication after surgery.  Increasing fluid intake and taking a stool softener (such as Colace) will usually help or prevent this problem from occurring.  A mild laxative (Milk of Magnesia or Miralax) should be taken according to package instructions if there are no bowel movements after 48 hours. °7. Unless discharge instructions indicate otherwise, you may remove your bandages 24-48 hours after surgery, and you may shower at that time.  You may have steri-strips (small skin tapes) in place directly over the incision.  These strips should be left on the skin for 7-10 days.  If your surgeon used skin glue on the incision, you may shower in 24 hours.  The glue will flake off over the next 2-3 weeks.  Any sutures or  staples will be removed at the office during your follow-up visit. °8. ACTIVITIES:  You may resume regular (light) daily activities beginning the next day--such as daily self-care, walking, climbing stairs--gradually increasing activities as tolerated.  You may have sexual intercourse when it is comfortable.  Refrain from any heavy lifting or straining until approved by your doctor. °a. You may drive when you are no longer taking prescription pain medication, you can comfortably wear a seatbelt, and you can safely maneuver your car and apply brakes. °b. RETURN TO WORK:  __________________________________________________________ °9. You should see your doctor in the office for a follow-up appointment approximately 2-3 weeks after your surgery.  Make sure that you call for this appointment within a day or two after you arrive home to insure a convenient appointment time. °10. OTHER INSTRUCTIONS: __________________________________________________________________________________________________________________________ __________________________________________________________________________________________________________________________ °WHEN TO CALL YOUR DOCTOR: °1. Fever over 101.0 °2. Inability to urinate °3. Continued bleeding from incision. °4. Increased pain, redness, or drainage from the incision. °5. Increasing abdominal pain ° °The clinic staff is available to answer your questions during regular business hours.  Please don’t hesitate to call and ask to speak to one of the nurses for clinical concerns.  If you have a medical emergency, go to the nearest emergency room or call 911.  A surgeon from [facility] is always on call at the hospital. °[address], [address], [place], [place]  [address] ? [address] [address], [place], [place]   [address] °[phone] ? [phone] ? FAX [phone] °Web site:   [email or web address] °

## 2014-01-10 NOTE — Discharge Summary (Signed)
General surgery attending:  I have personally interviewed and examined this patient today. I agree with the discharge summary and followup plans  [facility] M. Derrell LollingIngram, M.D., [facility] General and Minimally invasive Surgery Breast and Colorectal Surgery Office:   [phone] Pager:   [phone]

## 2014-01-11 ENCOUNTER — Encounter (HOSPITAL_COMMUNITY): Payer: Self-pay | Admitting: General Surgery

## 2014-01-30 ENCOUNTER — Encounter (HOSPITAL_COMMUNITY): Payer: Self-pay | Admitting: General Surgery

## 2014-08-16 ENCOUNTER — Encounter (HOSPITAL_COMMUNITY): Payer: Self-pay | Admitting: Emergency Medicine

## 2014-08-16 ENCOUNTER — Emergency Department (HOSPITAL_COMMUNITY)
Admission: EM | Admit: 2014-08-16 | Discharge: 2014-08-16 | Disposition: A | Discharge status: Home / Self Care | Payer: Medicaid Other | Attending: Emergency Medicine | Admitting: Emergency Medicine

## 2014-08-16 ENCOUNTER — Emergency Department (HOSPITAL_COMMUNITY): Payer: Medicaid Other

## 2014-08-16 DIAGNOSIS — Z8719 Personal history of other diseases of the digestive system: Secondary | ICD-10-CM | POA: Insufficient documentation

## 2014-08-16 DIAGNOSIS — Z720 Tobacco use: Secondary | ICD-10-CM | POA: Insufficient documentation

## 2014-08-16 DIAGNOSIS — Z8659 Personal history of other mental and behavioral disorders: Secondary | ICD-10-CM | POA: Insufficient documentation

## 2014-08-16 DIAGNOSIS — I10 Essential (primary) hypertension: Secondary | ICD-10-CM | POA: Insufficient documentation

## 2014-08-16 DIAGNOSIS — R52 Pain, unspecified: Secondary | ICD-10-CM | POA: Diagnosis present

## 2014-08-16 DIAGNOSIS — R0789 Other chest pain: Secondary | ICD-10-CM | POA: Diagnosis not present

## 2014-08-16 DIAGNOSIS — R05 Cough: Secondary | ICD-10-CM | POA: Insufficient documentation

## 2014-08-16 LAB — I-STAT CHEM 8, ED
BUN: 9 mg/dL (ref 6–20)
CALCIUM ION: 1.18 mmol/L (ref 1.12–1.23)
CHLORIDE: 103 mmol/L (ref 101–111)
Creatinine, Ser: 0.8 mg/dL (ref 0.44–1.00)
GLUCOSE: 121 mg/dL — AB (ref 65–99)
HCT: 47 % — ABNORMAL HIGH (ref 36.0–46.0)
Hemoglobin: 16 g/dL — ABNORMAL HIGH (ref 12.0–15.0)
Potassium: 3.8 mmol/L (ref 3.5–5.1)
Sodium: 139 mmol/L (ref 135–145)
TCO2: 20 mmol/L (ref 0–100)

## 2014-08-16 LAB — CBC WITH DIFFERENTIAL/PLATELET
Basophils Absolute: 0 10*3/uL (ref 0.0–0.1)
Basophils Relative: 0 % (ref 0–1)
Eosinophils Absolute: 0.1 10*3/uL (ref 0.0–0.7)
Eosinophils Relative: 1 % (ref 0–5)
HCT: 43.4 % (ref 36.0–46.0)
HEMOGLOBIN: 14.9 g/dL (ref 12.0–15.0)
LYMPHS ABS: 3.2 10*3/uL (ref 0.7–4.0)
LYMPHS PCT: 29 % (ref 12–46)
MCH: 28.3 pg (ref 26.0–34.0)
MCHC: 34.3 g/dL (ref 30.0–36.0)
MCV: 82.5 fL (ref 78.0–100.0)
MONOS PCT: 4 % (ref 3–12)
Monocytes Absolute: 0.5 10*3/uL (ref 0.1–1.0)
NEUTROS PCT: 66 % (ref 43–77)
Neutro Abs: 7.1 10*3/uL (ref 1.7–7.7)
Platelets: 303 10*3/uL (ref 150–400)
RBC: 5.26 MIL/uL — AB (ref 3.87–5.11)
RDW: 13.4 % (ref 11.5–15.5)
WBC: 10.9 10*3/uL — AB (ref 4.0–10.5)

## 2014-08-16 LAB — D-DIMER, QUANTITATIVE: D-Dimer, Quant: 0.4 ug/mL-FEU (ref 0.00–0.48)

## 2014-08-16 LAB — I-STAT TROPONIN, ED: TROPONIN I, POC: 0 ng/mL (ref 0.00–0.08)

## 2014-08-16 MED ORDER — IBUPROFEN 800 MG PO TABS: 800.0000 mg | ORAL_TABLET | Freq: Three times a day (TID) | ORAL | Status: DC

## 2014-08-16 MED ORDER — KETOROLAC TROMETHAMINE 30 MG/ML IJ SOLN
30.0000 mg | Freq: Once | INTRAMUSCULAR | Status: AC
  Administered 2014-08-16: 30 mg via INTRAVENOUS
  Filled 2014-08-16: qty 1

## 2014-08-16 NOTE — ED Notes (Signed)
Pt sts cough and congestion with pain with cough and inspiration; pt sts HA today as well

## 2014-08-16 NOTE — ED Provider Notes (Addendum)
CSN: [id number]     Arrival date & time 08/16/14  1118 History   First MD Initiated Contact with Patient 08/16/14 1232     Chief Complaint  Patient presents with  . Cough  . Generalized Body Aches     (Consider location/radiation/quality/duration/timing/severity/associated sxs/prior Treatment) Patient is a 39 y.o. female presenting with chest pain. The history is provided by the patient.  Chest Pain Pain location:  Substernal area Pain quality: sharp, shooting and stabbing   Pain radiates to:  Does not radiate Pain radiates to the back: no   Pain severity:  Severe Onset quality:  Sudden Duration:  4 hours Timing:  Constant Progression:  Unchanged Chronicity:  New Context: breathing   Context comment:  Coughing Relieved by:  None tried Worsened by:  Deep breathing and coughing Ineffective treatments:  None tried Associated symptoms: cough   Associated symptoms: no abdominal pain, no anorexia, no anxiety, no fever, no headache, no nausea, no shortness of breath, not vomiting and no weakness   Risk factors: hypertension and smoking   Risk factors: no coronary artery disease, no diabetes mellitus, no immobilization, not obese, no prior DVT/PE and no surgery   Risk factors comment:  Mom and dad in the 3740's-50's with heart attack   Past Medical History  Diagnosis Date  . Hypertension   . Abnormal Pap smear     2010  . History of stomach ulcers   . Bipolar 1 disorder    Past Surgical History  Procedure Laterality Date  . Cesarean section    . Tubal ligation    . Laparoscopic appendectomy N/A 01/09/2014    Procedure: APPENDECTOMY LAPAROSCOPIC;  Surgeon: Claud KelpHaywood Ingram, MD;  [facility];  Service: General;  Laterality: N/A;   Family History  Problem Relation Age of Onset  . Heart disease Mother   . Hypertension Mother   . Miscarriages / [place] Mother   . Heart disease Father   . Hypertension Father   . Heart disease Maternal Grandmother   . Cancer Paternal  Grandmother   . Cancer Paternal Grandfather    History  Substance Use Topics  . Smoking status: Current Every Day Smoker -- 1.00 packs/day  . Smokeless tobacco: Not on file  . Alcohol Use: No   OB History    Gravida Para Term Preterm AB TAB SAB Ectopic Multiple Living   4 4 3 1      4      Review of Systems  Constitutional: Negative for fever.  Respiratory: Positive for cough. Negative for shortness of breath.   Cardiovascular: Positive for chest pain.  Gastrointestinal: Negative for nausea, vomiting, abdominal pain and anorexia.  Neurological: Negative for weakness and headaches.  All other systems reviewed and are negative.     Allergies  Other  Home Medications   Prior to Admission medications   Medication Sig Start Date End Date Taking? Authorizing Provider  hydrochlorothiazide (HYDRODIURIL) 25 MG tablet Take 25 mg by mouth daily.    Historical Provider, MD  oxyCODONE-acetaminophen (PERCOCET/ROXICET) 5-325 MG per tablet Take 1-2 tablets by mouth every 4 (four) hours as needed for moderate pain. Patient not taking: Reported on 08/16/2014 01/10/14   Barnetta ChapelKelly Osborne, PA-C  propranolol (INDERAL) 40 MG tablet Take 40 mg by mouth daily.    Historical Provider, MD   BP 146/86 mmHg  Pulse 74  Temp(Src) 98.2 F (36.8 C) (Oral)  Resp 22  SpO2 100%  LMP 08/11/2014 Physical Exam  Constitutional: She is oriented to person,  place, and time. She appears well-developed and well-nourished. No distress.  HENT:  Head: Normocephalic and atraumatic.  Mouth/Throat: Oropharynx is clear and moist.  Eyes: Conjunctivae and EOM are normal. Pupils are equal, round, and reactive to light.  Neck: Normal range of motion. Neck supple.  Cardiovascular: Normal rate, regular rhythm and intact distal pulses.   No murmur heard. Pulmonary/Chest: Effort normal and breath sounds normal. No respiratory distress. She has no wheezes. She has no rales. She exhibits tenderness.  Abdominal: Soft. She  exhibits no distension. There is no tenderness. There is no rebound and no guarding.  Musculoskeletal: Normal range of motion. She exhibits no edema or tenderness.  Neurological: She is alert and oriented to person, place, and time.  Skin: Skin is warm and dry. No rash noted. No erythema.  Psychiatric: She has a normal mood and affect. Her behavior is normal.  Nursing note and vitals reviewed.   ED Course  Procedures (including critical care time) Labs Review Labs Reviewed  CBC WITH DIFFERENTIAL/PLATELET - Abnormal; Notable for the following:    WBC 10.9 (*)    RBC 5.26 (*)    All other components within normal limits  I-STAT CHEM 8, ED - Abnormal; Notable for the following:    Glucose, Bld 121 (*)    Hemoglobin 16.0 (*)    HCT 47.0 (*)    All other components within normal limits  D-DIMER, QUANTITATIVE  I-STAT TROPOININ, ED    Imaging Review Dg Chest 2 View (if Patient Has Fever And/or Copd)  08/16/2014   CLINICAL DATA:  Lateral chest pain bilaterally radiating to the middle of the chest. Shortness of breath and dry cough with weakness and body aches.  EXAM: CHEST  2 VIEW  COMPARISON:  None.  FINDINGS: Trachea is midline. Heart size normal. Lungs are clear. No pleural fluid.  IMPRESSION: Negative.   Electronically Signed   By: Leanna BattlesMelinda  Blietz M.D.   On: 08/16/2014 12:15     EKG Interpretation   Date/Time:  Wednesday Aug 16 2014 12:31:01 EDT Ventricular Rate:  92 PR Interval:  155 QRS Duration: 74 QT Interval:  368 QTC Calculation: 455 R Axis:   65 Text Interpretation:  Sinus rhythm Borderline T abnormalities, anterior  leads No significant change since last tracing Confirmed by Anitra LauthPLUNKETT  MD,  Alphonzo LemmingsWHITNEY ([id number]) on 08/16/2014 2:00:36 PM      MDM   Final diagnoses:  Chest wall pain    Pt with atypical story for CP that started abruptly 4 hours ago is sharp in nature and worse with deep breaths and coughing. She denies any URI symptoms are similar complaints in the past.  No risk factors for PE however patient was tachycardic upon arrival. Low risk Wells score.  Low suspicion for infectious etiology as patient has no infectious symptoms and denies any cough, sputum production, fever or shortness of breath. Patient has no heart history heart score of 2 for risk factors only.  No EKG findings to support pericarditis. EKG without significant findings.  CXR, CBC, BMP, trop and d-dimer all wnl.  After toradol pt feeling much better.   Will d/c home with chest wall pain.     Gwyneth SproutWhitney Lilliann Rossetti, MD 08/16/14 1448  Gwyneth SproutWhitney Lainee Lehrman, MD 08/16/14 [phone]

## 2014-08-16 NOTE — ED Notes (Signed)
Patient transported to X-ray 

## 2014-08-16 NOTE — Discharge Instructions (Signed)

## 2014-09-05 ENCOUNTER — Inpatient Hospital Stay (HOSPITAL_COMMUNITY): Payer: Medicaid Other

## 2014-09-05 ENCOUNTER — Inpatient Hospital Stay (HOSPITAL_COMMUNITY)
Admission: AD | Admit: 2014-09-05 | Discharge: 2014-09-05 | Disposition: A | Discharge status: Home / Self Care | Payer: Medicaid Other | Source: Ambulatory Visit | Attending: Obstetrics & Gynecology | Admitting: Obstetrics & Gynecology

## 2014-09-05 ENCOUNTER — Encounter (HOSPITAL_COMMUNITY): Payer: Self-pay

## 2014-09-05 DIAGNOSIS — R102 Pelvic and perineal pain unspecified side: Secondary | ICD-10-CM

## 2014-09-05 DIAGNOSIS — F17210 Nicotine dependence, cigarettes, uncomplicated: Secondary | ICD-10-CM | POA: Insufficient documentation

## 2014-09-05 DIAGNOSIS — N898 Other specified noninflammatory disorders of vagina: Secondary | ICD-10-CM | POA: Diagnosis not present

## 2014-09-05 HISTORY — DX: Unspecified ovarian cyst, right side: N83.201

## 2014-09-05 HISTORY — DX: Unspecified ovarian cyst, right side: N83.202

## 2014-09-05 LAB — URINALYSIS, ROUTINE W REFLEX MICROSCOPIC
BILIRUBIN URINE: NEGATIVE
Glucose, UA: NEGATIVE mg/dL
Hgb urine dipstick: NEGATIVE
Ketones, ur: NEGATIVE mg/dL
LEUKOCYTES UA: NEGATIVE
NITRITE: NEGATIVE
PROTEIN: NEGATIVE mg/dL
Specific Gravity, Urine: 1.015 (ref 1.005–1.030)
UROBILINOGEN UA: 1 mg/dL (ref 0.0–1.0)
pH: 6 (ref 5.0–8.0)

## 2014-09-05 LAB — POCT PREGNANCY, URINE: PREG TEST UR: NEGATIVE

## 2014-09-05 LAB — WET PREP, GENITAL
Clue Cells Wet Prep HPF POC: NONE SEEN
TRICH WET PREP: NONE SEEN
YEAST WET PREP: NONE SEEN

## 2014-09-05 LAB — HEPATITIS B SURFACE ANTIGEN: Hepatitis B Surface Ag: NEGATIVE

## 2014-09-05 MED ORDER — TRAMADOL HCL 50 MG PO TABS: 50.0000 mg | ORAL_TABLET | Freq: Four times a day (QID) | ORAL | Status: DC | PRN

## 2014-09-05 MED ORDER — KETOROLAC TROMETHAMINE 60 MG/2ML IM SOLN
60.0000 mg | Freq: Once | INTRAMUSCULAR | Status: AC
  Administered 2014-09-05: 60 mg via INTRAMUSCULAR
  Filled 2014-09-05: qty 2

## 2014-09-05 NOTE — MAU Provider Note (Signed)
History     CSN: [id number]  Arrival date and time: 09/05/14 0950   None     Chief Complaint  Patient presents with  . Dizziness  . Pelvic Pain   Pelvic Pain The patient's primary symptoms include pelvic pain and vaginal discharge. The patient's pertinent negatives include no genital itching, genital lesions, genital odor, genital rash, missed menses or vaginal bleeding. This is a new problem. The current episode started in the past 7 days. The problem occurs constantly. The problem has been unchanged. The pain is moderate. The problem affects both sides. She is not pregnant. Associated symptoms include abdominal pain and back pain. Pertinent negatives include no chills, constipation, diarrhea, dysuria, fever, flank pain, frequency, headaches, nausea or vomiting. The vaginal discharge was normal. There has been no bleeding. She has not been passing clots. She has not been passing tissue. Nothing aggravates the symptoms. She has tried nothing for the symptoms. She is sexually active. No, her partner does not have an STD. Her menstrual history has been regular. Her past medical history is significant for a Cesarean section.  Dizziness This is a new problem. The current episode started today. The problem occurs intermittently. The problem has been gradually improving. Associated symptoms include abdominal pain. Pertinent negatives include no chills, fever, headaches, myalgias, nausea, vertigo, visual change, vomiting or weakness. Nothing aggravates the symptoms. She has tried drinking for the symptoms. The treatment provided no relief.   This is a 39 y.o. female who presents with c/o pelvic pain which started on Friday.  States has never had this pain before. Does have a history of intermittent ovarian cysts, but no other GYN complications. Had one c/s followed by vaginal births. Desires STD testing. Also c/o lightheadedness today. 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    Note:  Told us tech today that she has had 32 miscarriages since having her tubes tied. Did not tell me this.   RN Note:   Expand All Collapse All   Has been feeling dizzy and light headed, about passed out a few times. Has been having pelvic pain on Friday.          OB History    Gravida Para Term Preterm AB TAB SAB Ectopic Multiple Living   4 4 3 1      4       Past Medical History  Diagnosis Date  . Hypertension   . Abnormal Pap smear     2010  . History of stomach ulcers   . Bipolar 1 disorder   . Bilateral ovarian cysts     Past Surgical History  Procedure Laterality Date  . Cesarean section    . Tubal ligation    . Laparoscopic appendectomy N/A 01/09/2014    Procedure: APPENDECTOMY LAPAROSCOPIC;  Surgeon: Claud Kelp, MD;  [facility];  Service: General;  Laterality: N/A;    Family History  Problem Relation Age of Onset  . Heart disease Mother   . Hypertension Mother   . Miscarriages / [place] Mother   . Heart disease Father   . Hypertension Father   . Heart disease Maternal Grandmother   . Cancer Paternal Grandmother   . Cancer Paternal Grandfather     History  Substance Use Topics  . Smoking status: Current Every Day Smoker -- 1.00 packs/day  . Smokeless tobacco: Not on file  . Alcohol Use: No    Allergies:  Allergies  Allergen Reactions  .  Other Nausea And Vomiting    Celery    Prescriptions prior to admission  Medication Sig Dispense Refill Last Dose  . ibuprofen (ADVIL,MOTRIN) 800 MG tablet Take 1 tablet (800 mg total) by mouth 3 (three) times daily. 21 tablet 0 Past Week at Unknown time    Review of Systems  Constitutional: Negative for fever, chills and malaise/fatigue.  Eyes: Negative for blurred vision and double vision.  Gastrointestinal: Positive for abdominal pain. Negative for nausea, vomiting, diarrhea and constipation.  Genitourinary: Positive for vaginal discharge and pelvic pain. Negative for dysuria,  frequency, flank pain and missed menses.  Musculoskeletal: Positive for back pain. Negative for myalgias.  Neurological: Positive for dizziness. Negative for vertigo, weakness and headaches.   Physical Exam   Blood pressure 139/96, pulse 85, temperature 98.5 F (36.9 C), temperature source Oral, resp. rate 18, height  (1.575 m), weight 191 lb (86.637 kg), last menstrual period 08/11/2014, SpO2 99 %.  Physical Exam  Constitutional: She is oriented to person, place, and time. She appears well-developed and well-nourished. No distress.  HENT:  Head: Normocephalic.  Cardiovascular: Normal rate and regular rhythm.   Respiratory: Effort normal. No respiratory distress.  GI: Soft. She exhibits no distension. There is no tenderness. There is no rebound and no guarding.  Genitourinary: Vaginal discharge (thin white) found.  Nodule in anterior vaginal wall, 1mm round, firm Cervix closed No CMT Uterus small, nontender Adnexa mildly tender bilaterally  Musculoskeletal: Normal range of motion.  Neurological: She is alert and oriented to person, place, and time.  Skin: Skin is warm and dry.  Psychiatric: She has a normal mood and affect.    MAU Course  Procedures  MDM Discussed with patient need for STD testing. Pain could be related to PID  She states she does not think she has STDs but wants to be tested "for everything"   Cultures done and blood ordered for HIV, Hepb, and RPR. Other concern could be ovarian cysts or pelvic pathologies, so will order [place]. Could be related to UTI, so UA ordered toradol ordered for pain relief.  Results for orders placed or performed during the hospital encounter of 09/05/14 (from the past 24 hour(s))  Urinalysis, Routine w reflex microscopic (not at [facility])     Status: None   Collection Time: 09/05/14 10:23 AM  Result Value Ref Range   Color, Urine YELLOW YELLOW   APPearance CLEAR CLEAR   Specific Gravity, Urine 1.015 1.005 - 1.030   pH 6.0 5.0 - 8.0    Glucose, UA NEGATIVE NEGATIVE mg/dL   Hgb urine dipstick NEGATIVE NEGATIVE   Bilirubin Urine NEGATIVE NEGATIVE   Ketones, ur NEGATIVE NEGATIVE mg/dL   Protein, ur NEGATIVE NEGATIVE mg/dL   Urobilinogen, UA 1.0 0.0 - 1.0 mg/dL   Nitrite NEGATIVE NEGATIVE   Leukocytes, UA NEGATIVE NEGATIVE  Pregnancy, urine POC     Status: None   Collection Time: 09/05/14 10:26 AM  Result Value Ref Range   Preg Test, Ur NEGATIVE NEGATIVE  Wet prep, genital     Status: Abnormal   Collection Time: 09/05/14 11:15 AM  Result Value Ref Range   Yeast Wet Prep HPF POC NONE SEEN NONE SEEN   Trich, Wet Prep NONE SEEN NONE SEEN   Clue Cells Wet Prep HPF POC NONE SEEN NONE SEEN   WBC, Wet Prep HPF POC FEW (A) NONE SEEN   Dg Chest 2 View (if Patient Has Fever And/or Copd)  08/16/2014   CLINICAL DATA:  Lateral  chest pain bilaterally radiating to the middle of the chest. Shortness of breath and dry cough with weakness and body aches.  EXAM: CHEST  2 VIEW  COMPARISON:  None.  FINDINGS: Trachea is midline. Heart size normal. Lungs are clear. No pleural fluid.  IMPRESSION: Negative.   Electronically Signed   By: Leanna BattlesMelinda  Blietz M.D.   On: 08/16/2014 12:15   [place] Transvaginal Non-ob  09/05/2014   CLINICAL DATA:  Worsening pelvic pain radiating to right abdomen for 4 days. LMP 08/13/2014.  EXAM: TRANSABDOMINAL AND TRANSVAGINAL ULTRASOUND OF PELVIS  TECHNIQUE: Both transabdominal and transvaginal ultrasound examinations of the pelvis were performed. Transabdominal technique was performed for global imaging of the pelvis including uterus, ovaries, adnexal regions, and pelvic cul-de-sac. It was necessary to proceed with endovaginal exam following the transabdominal exam to visualize the endometrial stripe and ovaries.  COMPARISON:  10/18/2011  FINDINGS: Uterus  Measurements: 7.6 x 4.2 x 4.6 cm. No fibroids or other mass visualized.  Endometrium  Thickness: 11 mm.  No focal abnormality visualized.  Right ovary  Measurements: 3.0 x  1.8 x 3.3 cm. Normal appearance/no adnexal mass.  Left ovary  Measurements: 2.8 x 1.9 x 2.8 cm. Normal appearance/no adnexal mass.  Other findings  A 4 mm echogenic focus with acoustic shadowing is along the left posterior bladder wall. This is suspicious for calculus within the urinary bladder or at the left ureterovesical junction.  IMPRESSION: Normal appearance of uterus and both ovaries.  4 mm echogenic focus along left posterior bladder wall, suspicious for urinary calculus within the bladder or left ureterovesical junction. Consider abdomen pelvis CT without contrast for further evaluation if clinically warranted.   Electronically Signed   By: Myles RosenthalJohn  Stahl M.D.   On: 09/05/2014 13:21   [place] Pelvis Complete  09/05/2014   CLINICAL DATA:  Worsening pelvic pain radiating to right abdomen for 4 days. LMP 08/13/2014.  EXAM: TRANSABDOMINAL AND TRANSVAGINAL ULTRASOUND OF PELVIS  TECHNIQUE: Both transabdominal and transvaginal ultrasound examinations of the pelvis were performed. Transabdominal technique was performed for global imaging of the pelvis including uterus, ovaries, adnexal regions, and pelvic cul-de-sac. It was necessary to proceed with endovaginal exam following the transabdominal exam to visualize the endometrial stripe and ovaries.  COMPARISON:  10/18/2011  FINDINGS: Uterus  Measurements: 7.6 x 4.2 x 4.6 cm. No fibroids or other mass visualized.  Endometrium  Thickness: 11 mm.  No focal abnormality visualized.  Right ovary  Measurements: 3.0 x 1.8 x 3.3 cm. Normal appearance/no adnexal mass.  Left ovary  Measurements: 2.8 x 1.9 x 2.8 cm. Normal appearance/no adnexal mass.  Other findings  A 4 mm echogenic focus with acoustic shadowing is along the left posterior bladder wall. This is suspicious for calculus within the urinary bladder or at the left ureterovesical junction.  IMPRESSION: Normal appearance of uterus and both ovaries.  4 mm echogenic focus along left posterior bladder wall, suspicious for  urinary calculus within the bladder or left ureterovesical junction. Consider abdomen pelvis CT without contrast for further evaluation if clinically warranted.   Electronically Signed   By: Myles RosenthalJohn  Stahl M.D.   On: 09/05/2014 13:21     Assessment and Plan  A; Pelvic pain     Echogenic focus along left posterior bladder wall, may be calculus     Possible STDs, but low suspicion     No evidence of ovarian cysts  P; Discharge home      Rx Tramadol and Ibuprofen for pain  Push fluids for stone       Given info for primary care doctors      Go back to ER if pain becomes more severe       Will not treat for STDs now       Cultures and serologic testng done for STDs.   [facility] 09/05/2014, 10:41 AM

## 2014-09-05 NOTE — MAU Note (Signed)
Has been feeling dizzy and light headed, about passed out a few times.  Has been having pelvic pain on Friday.

## 2014-09-05 NOTE — Discharge Instructions (Signed)
Abdominal Pain, Women °Abdominal (stomach, pelvic, or belly) pain can be caused by many things. It is important to tell your doctor: °· The location of the pain. °· Does it come and go or is it present all the time? °· Are there things that start the pain (eating certain foods, exercise)? °· Are there other symptoms associated with the pain (fever, nausea, vomiting, diarrhea)? °All of this is helpful to know when trying to find the cause of the pain. °CAUSES  °· Stomach: virus or bacteria infection, or ulcer. °· Intestine: appendicitis (inflamed appendix), regional ileitis (Crohn's disease), ulcerative colitis (inflamed colon), irritable bowel syndrome, diverticulitis (inflamed diverticulum of the colon), or cancer of the stomach or intestine. °· Gallbladder disease or stones in the gallbladder. °· Kidney disease, kidney stones, or infection. °· Pancreas infection or cancer. °· Fibromyalgia (pain disorder). °· Diseases of the female organs: °· Uterus: fibroid (non-cancerous) tumors or infection. °· Fallopian tubes: infection or tubal pregnancy. °· Ovary: cysts or tumors. °· Pelvic adhesions (scar tissue). °· Endometriosis (uterus lining tissue growing in the pelvis and on the pelvic organs). °· Pelvic congestion syndrome (female organs filling up with blood just before the menstrual period). °· Pain with the menstrual period. °· Pain with ovulation (producing an egg). °· Pain with an IUD (intrauterine device, birth control) in the uterus. °· Cancer of the female organs. °· Functional pain (pain not caused by a disease, may improve without treatment). °· Psychological pain. °· Depression. °DIAGNOSIS  °Your doctor will decide the seriousness of your pain by doing an examination. °· Blood tests. °· X-rays. °· Ultrasound. °· CT scan (computed tomography, special type of X-ray). °· MRI (magnetic resonance imaging). °· Cultures, for infection. °· Barium enema (dye inserted in the large intestine, to better view it with  X-rays). °· Colonoscopy (looking in intestine with a lighted tube). °· Laparoscopy (minor surgery, looking in abdomen with a lighted tube). °· Major abdominal exploratory surgery (looking in abdomen with a large incision). °TREATMENT  °The treatment will depend on the cause of the pain.  °· Many cases can be observed and treated at home. °· Over-the-counter medicines recommended by your caregiver. °· Prescription medicine. °· Antibiotics, for infection. °· Birth control pills, for painful periods or for ovulation pain. °· Hormone treatment, for endometriosis. °· Nerve blocking injections. °· Physical therapy. °· Antidepressants. °· Counseling with a psychologist or psychiatrist. °· Minor or major surgery. °HOME CARE INSTRUCTIONS  °· Do not take laxatives, unless directed by your caregiver. °· Take over-the-counter pain medicine only if ordered by your caregiver. Do not take aspirin because it can cause an upset stomach or bleeding. °· Try a clear liquid diet (broth or water) as ordered by your caregiver. Slowly move to a bland diet, as tolerated, if the pain is related to the stomach or intestine. °· Have a thermometer and take your temperature several times a day, and record it. °· Bed rest and sleep, if it helps the pain. °· Avoid sexual intercourse, if it causes pain. °· Avoid stressful situations. °· Keep your follow-up appointments and tests, as your caregiver orders. °· If the pain does not go away with medicine or surgery, you may try: °· Acupuncture. °· Relaxation exercises (yoga, meditation). °· Group therapy. °· Counseling. °SEEK MEDICAL CARE IF:  °· You notice certain foods cause stomach pain. °· Your home care treatment is not helping your pain. °· You need stronger pain medicine. °· You want your IUD removed. °· You feel faint or   lightheaded. °· You develop nausea and vomiting. °· You develop a rash. °· You are having side effects or an allergy to your medicine. °SEEK IMMEDIATE MEDICAL CARE IF:  °· Your  pain does not go away or gets worse. °· You have a fever. °· Your pain is felt only in portions of the abdomen. The right side could possibly be appendicitis. The left lower portion of the abdomen could be colitis or diverticulitis. °· You are passing blood in your stools (bright red or black tarry stools, with or without vomiting). °· You have blood in your urine. °· You develop chills, with or without a fever. °· You pass out. °MAKE SURE YOU:  °· Understand these instructions. °· Will watch your condition. °· Will get help right away if you are not doing well or get worse. °Document Released: 01/12/2007 Document Revised: 08/01/2013 Document Reviewed: 02/01/2009 °ExitCare® Patient Information ©2015 [employer]. This information is not intended to replace advice given to you by your health care provider. Make sure you discuss any questions you have with your health care provider. ° °Kidney Stones °Kidney stones (urolithiasis) are deposits that form inside your kidneys. The intense pain is caused by the stone moving through the urinary tract. When the stone moves, the ureter goes into spasm around the stone. The stone is usually passed in the urine.  °CAUSES  °· A disorder that makes certain neck glands produce too much parathyroid hormone (primary hyperparathyroidism). °· A buildup of uric acid crystals, similar to gout in your joints. °· Narrowing (stricture) of the ureter. °· A kidney obstruction present at birth (congenital obstruction). °· Previous surgery on the kidney or ureters. °· Numerous kidney infections. °SYMPTOMS  °· Feeling sick to your stomach (nauseous). °· Throwing up (vomiting). °· Blood in the urine (hematuria). °· Pain that usually spreads (radiates) to the groin. °· Frequency or urgency of urination. °DIAGNOSIS  °· Taking a history and physical exam. °· Blood or urine tests. °· CT scan. °· Occasionally, an examination of the inside of the urinary bladder (cystoscopy) is performed. °TREATMENT    °· Observation. °· Increasing your fluid intake. °· Extracorporeal shock wave lithotripsy--This is a noninvasive procedure that uses shock waves to break up kidney stones. °· Surgery may be needed if you have severe pain or persistent obstruction. There are various surgical procedures. Most of the procedures are performed with the use of small instruments. Only small incisions are needed to accommodate these instruments, so recovery time is minimized. °The size, location, and chemical composition are all important variables that will determine the proper choice of action for you. Talk to your health care provider to better understand your situation so that you will minimize the risk of injury to yourself and your kidney.  °HOME CARE INSTRUCTIONS  °· Drink enough water and fluids to keep your urine clear or pale yellow. This will help you to pass the stone or stone fragments. °· Strain all urine through the provided strainer. Keep all particulate matter and stones for your health care provider to see. The stone causing the pain may be as small as a grain of salt. It is very important to use the strainer each and every time you pass your urine. The collection of your stone will allow your health care provider to analyze it and verify that a stone has actually passed. The stone analysis will often identify what you can do to reduce the incidence of recurrences. °· Only take over-the-counter or prescription medicines for   pain, discomfort, or fever as directed by your health care provider. °· Make a follow-up appointment with your health care provider as directed. °· Get follow-up X-rays if required. The absence of pain does not always mean that the stone has passed. It may have only stopped moving. If the urine remains completely obstructed, it can cause loss of kidney function or even complete destruction of the kidney. It is your responsibility to make sure X-rays and follow-ups are completed. Ultrasounds of the  kidney can show blockages and the status of the kidney. Ultrasounds are not associated with any radiation and can be performed easily in a matter of minutes. °SEEK MEDICAL CARE IF: °· You experience pain that is progressive and unresponsive to any pain medicine you have been prescribed. °SEEK IMMEDIATE MEDICAL CARE IF:  °· Pain cannot be controlled with the prescribed medicine. °· You have a fever or shaking chills. °· The severity or intensity of pain increases over 18 hours and is not relieved by pain medicine. °· You develop a new onset of abdominal pain. °· You feel faint or pass out. °· You are unable to urinate. °MAKE SURE YOU:  °· Understand these instructions. °· Will watch your condition. °· Will get help right away if you are not doing well or get worse. °Document Released: 03/17/2005 Document Revised: 11/17/2012 Document Reviewed: 08/18/2012 °ExitCare® Patient Information ©2015 [employer]. This information is not intended to replace advice given to you by your health care provider. Make sure you discuss any questions you have with your health care provider. ° °

## 2014-09-06 LAB — RPR: RPR: NONREACTIVE

## 2014-09-06 LAB — GC/CHLAMYDIA PROBE AMP (~~LOC~~) NOT AT ARMC
CHLAMYDIA, DNA PROBE: NEGATIVE
NEISSERIA GONORRHEA: NEGATIVE

## 2014-09-06 LAB — HIV ANTIBODY (ROUTINE TESTING W REFLEX): HIV Screen 4th Generation wRfx: NONREACTIVE

## 2014-10-25 ENCOUNTER — Encounter (HOSPITAL_COMMUNITY): Payer: Self-pay | Admitting: Family Medicine

## 2014-10-25 ENCOUNTER — Emergency Department (HOSPITAL_COMMUNITY)
Admission: EM | Admit: 2014-10-25 | Discharge: 2014-10-25 | Disposition: A | Discharge status: Home / Self Care | Payer: Medicaid Other | Attending: Emergency Medicine | Admitting: Emergency Medicine

## 2014-10-25 DIAGNOSIS — Y998 Other external cause status: Secondary | ICD-10-CM | POA: Diagnosis not present

## 2014-10-25 DIAGNOSIS — Z23 Encounter for immunization: Secondary | ICD-10-CM | POA: Insufficient documentation

## 2014-10-25 DIAGNOSIS — S80861A Insect bite (nonvenomous), right lower leg, initial encounter: Secondary | ICD-10-CM | POA: Diagnosis not present

## 2014-10-25 DIAGNOSIS — Z8742 Personal history of other diseases of the female genital tract: Secondary | ICD-10-CM | POA: Insufficient documentation

## 2014-10-25 DIAGNOSIS — W57XXXA Bitten or stung by nonvenomous insect and other nonvenomous arthropods, initial encounter: Secondary | ICD-10-CM | POA: Insufficient documentation

## 2014-10-25 DIAGNOSIS — Y9389 Activity, other specified: Secondary | ICD-10-CM | POA: Diagnosis not present

## 2014-10-25 DIAGNOSIS — Z791 Long term (current) use of non-steroidal anti-inflammatories (NSAID): Secondary | ICD-10-CM | POA: Insufficient documentation

## 2014-10-25 DIAGNOSIS — Y9289 Other specified places as the place of occurrence of the external cause: Secondary | ICD-10-CM | POA: Insufficient documentation

## 2014-10-25 DIAGNOSIS — Z720 Tobacco use: Secondary | ICD-10-CM | POA: Insufficient documentation

## 2014-10-25 DIAGNOSIS — I10 Essential (primary) hypertension: Secondary | ICD-10-CM | POA: Diagnosis not present

## 2014-10-25 DIAGNOSIS — Z8719 Personal history of other diseases of the digestive system: Secondary | ICD-10-CM | POA: Diagnosis not present

## 2014-10-25 DIAGNOSIS — Z8659 Personal history of other mental and behavioral disorders: Secondary | ICD-10-CM | POA: Insufficient documentation

## 2014-10-25 MED ORDER — CEPHALEXIN 500 MG PO CAPS: 500.0000 mg | ORAL_CAPSULE | Freq: Four times a day (QID) | ORAL | Status: DC

## 2014-10-25 MED ORDER — BACITRACIN 500 UNIT/GM EX OINT
1.0000 "application " | TOPICAL_OINTMENT | Freq: Two times a day (BID) | CUTANEOUS | Status: DC
  Administered 2014-10-25: 1 via TOPICAL
  Filled 2014-10-25: qty 0.9

## 2014-10-25 MED ORDER — TETANUS-DIPHTH-ACELL PERTUSSIS 5-2.5-18.5 LF-MCG/0.5 IM SUSP
0.5000 mL | Freq: Once | INTRAMUSCULAR | Status: AC
  Administered 2014-10-25: 0.5 mL via INTRAMUSCULAR
  Filled 2014-10-25: qty 0.5

## 2014-10-25 NOTE — ED Notes (Signed)
Pt given ice pack

## 2014-10-25 NOTE — ED Provider Notes (Signed)
CSN: [id number]     Arrival date & time 10/25/14  1446 History  This chart was scribed for non-physician practitioner, Roxy Horseman, PA-C, working with Blane Ohara, MD by Charline Bills, ED Scribe. This patient was seen in room TR06C/TR06C and the patient's care was started at 3:47 PM.   Chief Complaint  Patient presents with  . Insect Bite  . Leg Pain   The history is provided by the patient. No language interpreter was used.   HPI Comments: Casey Adams is a 39 y.o. female, with a h/o HTN, who presents to the Emergency Department with a chief complaint of a gradually worsening insect bite to right lower leg for the past few days. Pt reports associated redness surrounding the bite as well as gradually worsening, burning sensation to the affected area. She has tried Peroxide without significant relief. Pt's last tdap is unknown. No h/o DM.   Past Medical History  Diagnosis Date  . Hypertension   . Abnormal Pap smear     2010  . History of stomach ulcers   . Bipolar 1 disorder   . Bilateral ovarian cysts    Past Surgical History  Procedure Laterality Date  . Cesarean section    . Tubal ligation    . Laparoscopic appendectomy N/A 01/09/2014    Procedure: APPENDECTOMY LAPAROSCOPIC;  Surgeon: Claud Kelp, MD;  Location: [facility] OR;  Service: General;  Laterality: N/A;   Family History  Problem Relation Age of Onset  . Heart disease Mother   . Hypertension Mother   . Miscarriages / [place] Mother   . Heart disease Father   . Hypertension Father   . Heart disease Maternal Grandmother   . Cancer Paternal Grandmother   . Cancer Paternal Grandfather    History  Substance Use Topics  . Smoking status: Current Every Day Smoker -- 1.00 packs/day  . Smokeless tobacco: Not on file  . Alcohol Use: No   OB History    Gravida Para Term Preterm AB TAB SAB Ectopic Multiple Living   4 4 3 1      4      Review of Systems  Constitutional: Negative for fever and chills.   Respiratory: Negative for shortness of breath.   Cardiovascular: Negative for chest pain.  Gastrointestinal: Negative for nausea, vomiting, diarrhea and constipation.  Genitourinary: Negative for dysuria.  Skin: Positive for color change and wound.   Allergies  Other  Home Medications   Prior to Admission medications   Medication Sig Start Date End Date Taking? Authorizing Provider  ibuprofen (ADVIL,MOTRIN) 800 MG tablet Take 1 tablet (800 mg total) by mouth 3 (three) times daily. 08/16/14   Gwyneth Sprout, MD  traMADol (ULTRAM) 50 MG tablet Take 1 tablet (50 mg total) by mouth every 6 (six) hours as needed. 09/05/14   Aviva Signs, CNM   BP 153/93 mmHg  Pulse 83  Temp(Src) 98.6 F (37 C)  Resp 18  SpO2 99%  LMP 10/11/2014 Physical Exam  Constitutional: She is oriented to person, place, and time. She appears well-developed and well-nourished. No distress.  HENT:  Head: Normocephalic and atraumatic.  Eyes: Conjunctivae and EOM are normal.  Neck: Neck supple. No tracheal deviation present.  Cardiovascular: Normal rate, regular rhythm and normal heart sounds.  Exam reveals no gallop and no friction rub.   No murmur heard. Pulmonary/Chest: Effort normal. No respiratory distress. She has no wheezes. She has no rales. She exhibits no tenderness.  Musculoskeletal: Normal range of motion.  Neurological: She is alert and oriented to person, place, and time.  Skin: Skin is warm and dry.  R LE: Scattered mosquito bite to R LE.   Psychiatric: She has a normal mood and affect. Her behavior is normal.  Nursing note and vitals reviewed.  ED Course  Procedures (including critical care time) DIAGNOSTIC STUDIES: Oxygen Saturation is 99% on RA, normal by my interpretation.    COORDINATION OF CARE: 3:49 PM-Discussed treatment plan which includes tdap and Keflex with pt at bedside and pt agreed to plan.   Labs Review Labs Reviewed - No data to display  Imaging Review No results  found.   EKG Interpretation None      MDM   Final diagnoses:  Insect bite    Patient with multiple mosquito bites to her lower extremities. She is concerned about a single bite of the distal right leg, which she states has had some mild erythema surrounding it over the past 2 days. I will give her a small prescription for Keflex. Have instructed the patient to stop using peroxide and stop scratching. Patient understands and agrees to plan. She is stable and ready for discharge.  I personally performed the services described in this documentation, which was scribed in my presence. The recorded information has been reviewed and is accurate.    Roxy Horseman, PA-C 10/25/14 1609  Blane Ohara, MD 10/25/14 [phone]

## 2014-10-25 NOTE — ED Notes (Signed)
Pt c/o ? Insect bite to right lower leg for several days.  Pt c/o pain, redness and swelling to area.

## 2014-10-25 NOTE — Discharge Instructions (Signed)

## 2014-10-25 NOTE — ED Notes (Signed)
Pt here for insect bite to RLE and pain in leg.

## 2017-03-04 ENCOUNTER — Emergency Department (HOSPITAL_COMMUNITY)
Admission: EM | Admit: 2017-03-04 | Discharge: 2017-03-04 | Discharge status: Against Medical Advice | Payer: Self-pay | Attending: Emergency Medicine | Admitting: Emergency Medicine

## 2017-03-04 ENCOUNTER — Encounter (HOSPITAL_COMMUNITY): Payer: Self-pay | Admitting: Emergency Medicine

## 2017-03-04 ENCOUNTER — Emergency Department (HOSPITAL_COMMUNITY): Payer: Self-pay

## 2017-03-04 ENCOUNTER — Other Ambulatory Visit: Payer: Self-pay

## 2017-03-04 DIAGNOSIS — R05 Cough: Secondary | ICD-10-CM | POA: Insufficient documentation

## 2017-03-04 DIAGNOSIS — R0602 Shortness of breath: Secondary | ICD-10-CM | POA: Insufficient documentation

## 2017-03-04 DIAGNOSIS — F319 Bipolar disorder, unspecified: Secondary | ICD-10-CM | POA: Insufficient documentation

## 2017-03-04 DIAGNOSIS — I10 Essential (primary) hypertension: Secondary | ICD-10-CM | POA: Insufficient documentation

## 2017-03-04 DIAGNOSIS — F17200 Nicotine dependence, unspecified, uncomplicated: Secondary | ICD-10-CM | POA: Insufficient documentation

## 2017-03-04 DIAGNOSIS — R0789 Other chest pain: Secondary | ICD-10-CM | POA: Insufficient documentation

## 2017-03-04 DIAGNOSIS — R062 Wheezing: Secondary | ICD-10-CM | POA: Insufficient documentation

## 2017-03-04 LAB — I-STAT BETA HCG BLOOD, ED (MC, WL, AP ONLY)

## 2017-03-04 LAB — CBC
HCT: 40.2 % (ref 36.0–46.0)
Hemoglobin: 13.5 g/dL (ref 12.0–15.0)
MCH: 28.6 pg (ref 26.0–34.0)
MCHC: 33.6 g/dL (ref 30.0–36.0)
MCV: 85.2 fL (ref 78.0–100.0)
PLATELETS: 226 10*3/uL (ref 150–400)
RBC: 4.72 MIL/uL (ref 3.87–5.11)
RDW: 15.1 % (ref 11.5–15.5)
WBC: 9.6 10*3/uL (ref 4.0–10.5)

## 2017-03-04 LAB — BASIC METABOLIC PANEL
Anion gap: 9 (ref 5–15)
BUN: 12 mg/dL (ref 6–20)
CO2: 23 mmol/L (ref 22–32)
CREATININE: 0.73 mg/dL (ref 0.44–1.00)
Calcium: 9.5 mg/dL (ref 8.9–10.3)
Chloride: 107 mmol/L (ref 101–111)
GFR calc non Af Amer: >60 mL/min (ref >60)
Glucose, Bld: 94 mg/dL (ref 65–99)
Potassium: 4 mmol/L (ref 3.5–5.1)
SODIUM: 139 mmol/L (ref 135–145)

## 2017-03-04 LAB — I-STAT TROPONIN, ED: Troponin i, poc: 0.01 ng/mL (ref 0.00–0.08)

## 2017-03-04 MED ORDER — BENZONATATE 100 MG PO CAPS
100.0000 mg | ORAL_CAPSULE | Freq: Once | ORAL | Status: DC
  Filled 2017-03-04: qty 1

## 2017-03-04 MED ORDER — ALBUTEROL SULFATE HFA 108 (90 BASE) MCG/ACT IN AERS
2.0000 | INHALATION_SPRAY | RESPIRATORY_TRACT | Status: DC | PRN
  Filled 2017-03-04: qty 6.7

## 2017-03-04 NOTE — ED Provider Notes (Signed)
[facility] EMERGENCY DEPARTMENT Provider Note   CSN: [id number] Arrival date & time: 03/04/17  1610     History   Chief Complaint Chief Complaint  Patient presents with  . Chest Pain    HPI Lether Tesch is a 41 y.o. female.  HPI   41 year old female with history of bipolar, hypertension, and history of stomach ulcer presenting for evaluation of chest pain.  Patient report for the past 2-3 weeks she has had recurrent chest pain.  She described pain as "someone is hitting on my chest". The pain wraps around her chest and upper back, which is waxing and waning throughout the day.  Pain is worse with sometimes positional change and with coughing.  Cough is productive with white sputum.  Currently endorses 6 out of 10 pain which is been present throughout the day.  She endorsed occasional wheezing and shortness of breath.  She denies having fever but no chills.  Denies vomiting or diarrhea abdominal pain pain lightheadedness or dizziness.  She is a smoker and smokes a pack a day.  Denies any prior cardiac complication.  Denies any specific treatment tried.  She does not have a primary care provider.  She denies coughing up blood, vomiting of blood, hematochezia or melena.  Does have history of heartburn but states that this felt different.  Past Medical History:  Diagnosis Date  . Abnormal Pap smear    2010  . Bilateral ovarian cysts   . Bipolar 1 disorder (HCC)   . History of stomach ulcers   . Hypertension     Patient Active Problem List   Diagnosis Date Noted  . Pelvic pain in female 09/05/2014  . Acute appendicitis 01/09/2014    Past Surgical History:  Procedure Laterality Date  . CESAREAN SECTION    . LAPAROSCOPIC APPENDECTOMY N/A 01/09/2014   Procedure: APPENDECTOMY LAPAROSCOPIC;  Surgeon: Claud Kelp, MD;  Location: [facility] OR;  Service: General;  Laterality: N/A;  . TUBAL LIGATION      OB History    Gravida Para Term Preterm AB Living   4 4 3 1    4    SAB TAB Ectopic Multiple Live Births           4       Home Medications    Prior to Admission medications   Medication Sig Start Date End Date Taking? Authorizing Provider  cephALEXin (KEFLEX) 500 MG capsule Take 1 capsule (500 mg total) by mouth 4 (four) times daily. 10/25/14   Roxy Horseman, PA-C  ibuprofen (ADVIL,MOTRIN) 800 MG tablet Take 1 tablet (800 mg total) by mouth 3 (three) times daily. 08/16/14   Gwyneth Sprout, MD  traMADol (ULTRAM) 50 MG tablet Take 1 tablet (50 mg total) by mouth every 6 (six) hours as needed. 09/05/14   Aviva Signs, CNM    Family History Family History  Problem Relation Age of Onset  . Heart disease Mother   . Hypertension Mother   . Miscarriages / [place] Mother   . Heart disease Father   . Hypertension Father   . Heart disease Maternal Grandmother   . Cancer Paternal Grandmother   . Cancer Paternal Grandfather     Social History Social History   Tobacco Use  . Smoking status: Current Every Day Smoker    Packs/day: 1.00  . Smokeless tobacco: Never Used  Substance Use Topics  . Alcohol use: No  . Drug use: No     Allergies   Other  Review of Systems Review of Systems  All other systems reviewed and are negative.    Physical Exam Updated Vital Signs BP 139/84 (BP Location: Right Arm)   Pulse 80   Temp 97.8 F (36.6 C) (Oral)   Resp 18   LMP  (Within Weeks)   SpO2 100%   Physical Exam  Constitutional: She appears well-developed and well-nourished. No distress.  Awake, alert, nontoxic appearance  HENT:  Head: Atraumatic.  Eyes: Conjunctivae are normal. Right eye exhibits no discharge. Left eye exhibits no discharge.  Neck: Neck supple.  Cardiovascular: Normal rate, regular rhythm, intact distal pulses and normal pulses.  Pulmonary/Chest: No respiratory distress. She exhibits no tenderness.  Faint scattered wheezes and rhonchi heard  Abdominal: Soft. There is no tenderness. There is no rebound.    Musculoskeletal: She exhibits no tenderness.       Right lower leg: She exhibits no tenderness and no edema.       Left lower leg: She exhibits no edema.  ROM appears intact, no obvious focal weakness  Neurological:  Mental status and motor strength appears intact  Skin: No rash noted.  Psychiatric: She has a normal mood and affect.  Nursing note and vitals reviewed.    ED Treatments / Results  Labs (all labs ordered are listed, but only abnormal results are displayed) Labs Reviewed  BASIC METABOLIC PANEL  CBC  I-STAT TROPONIN, ED  I-STAT BETA HCG BLOOD, ED ([facility], [facility], [facility] ONLY)    EKG  EKG Interpretation  Date/Time:  Wednesday March 04 2017 16:17:28 EST Ventricular Rate:  83 PR Interval:  162 QRS Duration: 68 QT Interval:  366 QTC Calculation: 430 R Axis:   20 Text Interpretation:  Normal sinus rhythm Cannot rule out Anterior infarct , age undetermined Abnormal ECG T wave changes similar to 2016 Confirmed by Pricilla LovelessGoldston, Scott [phone]([id number]) on 03/04/2017 7:06:53 PM       Radiology Dg Chest 2 View  Result Date: 03/04/2017 CLINICAL DATA:  New onset after chest pain for 3-4 days, feels like someone hit me in the chest, cough for 3-4 weeks, history hypertension EXAM: CHEST  2 VIEW COMPARISON:  08/16/2014 FINDINGS: Normal heart size, mediastinal contours, and pulmonary vascularity. Lungs clear. No pleural effusion or pneumothorax. Bones unremarkable. IMPRESSION: Normal exam. Electronically Signed   By: Ulyses SouthwardMark  Boles M.D.   On: 03/04/2017 16:52    Procedures Procedures (including critical care time)  Medications Ordered in ED Medications - No data to display   Initial Impression / Assessment and Plan / ED Course  I have reviewed the triage vital signs and the nursing notes.  Pertinent labs & imaging results that were available during my care of the patient were reviewed by me and considered in my medical decision making (see chart for details).     BP 139/84 (BP Location:  Right Arm)   Pulse 80   Temp 97.8 F (36.6 C) (Oral)   Resp 18   LMP  (Within Weeks)   SpO2 100%    Final Clinical Impressions(s) / ED Diagnoses   Final diagnoses:  Atypical chest pain    ED Discharge Orders    None     7:51 PM Patient here with complaints of chest pain.  Pain appears to be atypical for ACS.  Her heart score is 2, low risk of MACE.  sxs suggestive of bronchitis.   8:12 PM Nurse notified that pt have eloped prior to receiving any treatment.     Fayrene Helperran, Jurni Cesaro, PA-C  03/04/17 2012    Pricilla LovelessGoldston, Scott, MD 03/17/17 1141

## 2017-03-04 NOTE — ED Notes (Signed)
Have been looking for pt for 20 minutes, no one saw pt leave will continue to look for pt

## 2017-03-04 NOTE — ED Triage Notes (Signed)
Pt to ER for evaluation of new onset central chest pain states "it feels like someone hit me in the chest" x 3-4 days. States cough x3-4 weeks. States hx of pneumonia. VSS.

## 2017-03-04 NOTE — ED Notes (Signed)
When attempting to draw troponin pt is no where to be found

## 2017-03-04 NOTE — ED Notes (Signed)
Alerted PA pt left AMA pt stable and ambulatory when RN saw pt earlier

## 2017-05-13 ENCOUNTER — Emergency Department (HOSPITAL_COMMUNITY)
Admission: EM | Admit: 2017-05-13 | Discharge: 2017-05-14 | Disposition: A | Discharge status: Home / Self Care | Payer: Self-pay | Attending: Emergency Medicine | Admitting: Emergency Medicine

## 2017-05-13 ENCOUNTER — Emergency Department (HOSPITAL_COMMUNITY): Payer: Self-pay

## 2017-05-13 ENCOUNTER — Encounter (HOSPITAL_COMMUNITY): Payer: Self-pay | Admitting: Emergency Medicine

## 2017-05-13 ENCOUNTER — Other Ambulatory Visit: Payer: Self-pay

## 2017-05-13 DIAGNOSIS — R519 Headache, unspecified: Secondary | ICD-10-CM

## 2017-05-13 DIAGNOSIS — I10 Essential (primary) hypertension: Secondary | ICD-10-CM | POA: Insufficient documentation

## 2017-05-13 DIAGNOSIS — F17200 Nicotine dependence, unspecified, uncomplicated: Secondary | ICD-10-CM | POA: Insufficient documentation

## 2017-05-13 DIAGNOSIS — R51 Headache: Secondary | ICD-10-CM | POA: Insufficient documentation

## 2017-05-13 DIAGNOSIS — H81392 Other peripheral vertigo, left ear: Secondary | ICD-10-CM | POA: Insufficient documentation

## 2017-05-13 MED ORDER — DIAZEPAM 2 MG PO TABS
2.0000 mg | ORAL_TABLET | Freq: Once | ORAL | Status: AC
Start: 2017-05-14 — End: 2017-05-14
  Administered 2017-05-14: 2 mg via ORAL
  Filled 2017-05-13: qty 1

## 2017-05-13 MED ORDER — MECLIZINE HCL 25 MG PO TABS
25.0000 mg | ORAL_TABLET | Freq: Once | ORAL | Status: AC
  Administered 2017-05-13: 25 mg via ORAL
  Filled 2017-05-13: qty 1

## 2017-05-13 MED ORDER — ONDANSETRON HCL 4 MG/2ML IJ SOLN
4.0000 mg | Freq: Once | INTRAMUSCULAR | Status: AC
  Administered 2017-05-14: 4 mg via INTRAVENOUS
  Filled 2017-05-13: qty 2

## 2017-05-13 MED ORDER — SODIUM CHLORIDE 0.9 % IV BOLUS (SEPSIS)
1000.0000 mL | Freq: Once | INTRAVENOUS | Status: AC
  Administered 2017-05-14: 1000 mL via INTRAVENOUS

## 2017-05-13 MED ORDER — KETOROLAC TROMETHAMINE 30 MG/ML IJ SOLN
30.0000 mg | Freq: Once | INTRAMUSCULAR | Status: AC
  Administered 2017-05-14: 30 mg via INTRAVENOUS
  Filled 2017-05-13: qty 1

## 2017-05-13 NOTE — ED Provider Notes (Signed)
Patient placed in Quick Look pathway, seen and evaluated   Chief Complaint: Dizziness, headache  HPI:   Casey FosterRebecca Adams is a 42 y.o. female who presents to the Emergency Department complaining of dizziness described as the room spinning which began today. No alleviating or aggravating factors noted. Dizziness does not worsen with head movements or position changes. Associated with headache. Denies history of similar.    ROS: + dizziness, headache   - fever, neck pain, vomiting  Physical Exam:   Gen: No distress  Skin: Warm    Focused Exam: Eyes: + horizontal nystagmus       Neuro: CN 2-12 intact.    Initiation of care has begun. The patient has been counseled on the process, plan, and necessity for staying for the completion/evaluation, and the remainder of the medical screening examination    Ward, Chase PicketJaime Pilcher, PA-C 05/13/17 2108    Terrilee FilesButler, Michael C, MD 05/14/17 1131

## 2017-05-13 NOTE — ED Triage Notes (Signed)
Patient to ED for new dizziness and headache starting today - reports it feels like the room is spinning, she states she has lost balance a few times today, no LOC. No focal symptoms. PERRL. Ambulatory with steady gait. A&O x 4.

## 2017-05-13 NOTE — ED Provider Notes (Signed)
TIME SEEN: 11:55 PM  CHIEF COMPLAINT: Headache, vertigo  HPI: Patient is a 42 year old female with history of hypertension, tobacco use who presents to the emergency department with posterior headache that started gradually at 2:30 PM yesterday.  States has had similar headaches before and usually ibuprofen helps her headaches but did not take any medication prior to arrival.  Also reports feeling like the room has been spinning.  This has been ongoing for several weeks.  Has previous history of vertigo.  Symptoms worse when looking to the left.  Does have some tenderness in the left ear for the past couple of days but no hearing loss or ear pain.  No head injury.  Not on blood thinners.  No numbness, tingling or focal weakness.  No current chest pain or shortness of breath.  Denies nausea or vomiting.  No recent diarrhea.  No fever.  ROS: See HPI Constitutional: no fever  Eyes: no drainage  ENT: no runny nose   Cardiovascular:  no chest pain  Resp: no SOB  GI: no vomiting GU: no dysuria Integumentary: no rash  Allergy: no hives  Musculoskeletal: no leg swelling  Neurological: no slurred speech ROS otherwise negative  PAST MEDICAL HISTORY/PAST SURGICAL HISTORY:  Past Medical History:  Diagnosis Date  . Abnormal Pap smear    2010  . Bilateral ovarian cysts   . Bipolar 1 disorder (HCC)   . History of stomach ulcers   . Hypertension     MEDICATIONS:  Prior to Admission medications   Medication Sig Start Date End Date Taking? Authorizing Provider  cephALEXin (KEFLEX) 500 MG capsule Take 1 capsule (500 mg total) by mouth 4 (four) times daily. 10/25/14   Roxy Horseman, PA-C  ibuprofen (ADVIL,MOTRIN) 800 MG tablet Take 1 tablet (800 mg total) by mouth 3 (three) times daily. 08/16/14   Gwyneth Sprout, MD  traMADol (ULTRAM) 50 MG tablet Take 1 tablet (50 mg total) by mouth every 6 (six) hours as needed. 09/05/14   Aviva Signs, CNM    ALLERGIES:  No Known Allergies  SOCIAL  HISTORY:  Social History   Tobacco Use  . Smoking status: Current Every Day Smoker    Packs/day: 1.00  . Smokeless tobacco: Never Used  Substance Use Topics  . Alcohol use: No    FAMILY HISTORY: Family History  Problem Relation Age of Onset  . Heart disease Mother   . Hypertension Mother   . Miscarriages / [place] Mother   . Heart disease Father   . Hypertension Father   . Heart disease Maternal Grandmother   . Cancer Paternal Grandmother   . Cancer Paternal Grandfather     EXAM: BP (!) 153/82   Pulse 81   Temp 98.5 F (36.9 C)   Resp 20   Ht 5\' 3"  (1.6 m)   LMP 04/29/2017 Comment: tubal ligation  SpO2 99%   BMI 33.83 kg/m  CONSTITUTIONAL: Alert and oriented and responds appropriately to questions. Well-appearing; well-nourished HEAD: Normocephalic EYES: Conjunctivae clear, pupils appear equal, EOMI ENT: normal nose; moist mucous membranes; No pharyngeal erythema or petechiae, no tonsillar hypertrophy or exudate, no uvular deviation, no unilateral swelling, no trismus or drooling, no muffled voice, normal phonation, no stridor, no dental caries present, no drainable dental abscess noted, no Ludwig's angina, tongue sits flat in the bottom of the mouth, no angioedema, no facial erythema or warmth, no facial swelling; no pain with movement of the neck.  TMs are clear bilaterally without erythema, purulence, bulging, perforation,  effusion.  No cerumen impaction or sign of foreign body in the external auditory canal. No inflammation, erythema or drainage from the external auditory canal. No signs of mastoiditis. No pain with manipulation of the pinna bilaterally. NECK: Supple, no meningismus, no nuchal rigidity, no LAD  CARD: RRR; S1 and S2 appreciated; no murmurs, no clicks, no rubs, no gallops RESP: Normal chest excursion without splinting or tachypnea; breath sounds clear and equal bilaterally; no wheezes, no rhonchi, no rales, no hypoxia or respiratory distress, speaking  full sentences ABD/GI: Normal bowel sounds; non-distended; soft, non-tender, no rebound, no guarding, no peritoneal signs, no hepatosplenomegaly BACK:  The back appears normal and is non-tender to palpation, there is no CVA tenderness EXT: Normal ROM in all joints; non-tender to palpation; no edema; normal capillary refill; no cyanosis, no calf tenderness or swelling    SKIN: Normal color for age and race; warm; no rash NEURO: Moves all extremities equally; Strength 5/5 in all four extremities.  Normal sensation diffusely.  CN 2-12 grossly intact.  No dysmetria to finger to nose testing bilaterally.  Normal speech.  Normal gait.  Patient does have some fatigable horizontal nystagmus bilaterally but worse when looking to the left PSYCH: The patient's mood and manner are appropriate. Grooming and personal hygiene are appropriate.  MEDICAL DECISION MAKING: Patient here with posterior headache.  CT obtained in triage shows no acute abnormality.  Low suspicion for intracranial hemorrhage, stroke, meningitis.  She has history of vertigo and presents with the same.  History of similar headaches.  It was not sudden onset or thunderclap.  Not the worst headache of her life.  Will treat with Toradol, Zofran.  Meclizine did provide some relief of her vertiginous symptoms.  Will give Valium and IV fluids.  Basic labs are pending.  I do not feel she needs an MRI of her brain at this time.  ED PROGRESS: Labs are unremarkable.  EKG shows no arrhythmia or ischemic change.  No interval abnormality.   Patient has been able to ambulate without difficulty.  Reports headache and was completely gone after Toradol.  Vertigo also improving.  Will discharge with meclizine.  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  Will also give PCP follow-up.  Recommended Tylenol and ibuprofen at home for headaches as needed.   At this time, I do not feel there is any life-threatening  condition present. I have reviewed and discussed all results (EKG, imaging, lab, urine as appropriate) and exam findings with patient/family. I have reviewed nursing notes and appropriate previous records.  I feel the patient is safe to be discharged home without further emergent workup and can continue workup as an outpatient as needed. Discussed usual and customary return precautions. Patient/family verbalize understanding and are comfortable with this plan.  Outpatient follow-up has been provided if needed. All questions have been answered.     EKG Interpretation  Date/Time:  Thursday May 14 2017 00:36:33 EST Ventricular Rate:  73 PR Interval:    QRS Duration: 84 QT Interval:  409 QTC Calculation: 451 R Axis:   40 Text Interpretation:  Sinus rhythm Anteroseptal infarct, old No significant change since last tracing Confirmed by Ward, Baxter HireKristen [id number]) on 05/14/2017 12:39:09 AM         Ward, Layla MawKristen N, DO 05/14/17 0302

## 2017-05-13 NOTE — ED Notes (Signed)
Attempted to get blood from pt. RN unsuccessful at this time will call phlebotomy to get blood.

## 2017-05-14 LAB — CBC WITH DIFFERENTIAL/PLATELET
BASOS ABS: 0 10*3/uL (ref 0.0–0.1)
BASOS PCT: 0 %
EOS ABS: 0.3 10*3/uL (ref 0.0–0.7)
Eosinophils Relative: 3 %
HCT: 40.3 % (ref 36.0–46.0)
Hemoglobin: 13.7 g/dL (ref 12.0–15.0)
Lymphocytes Relative: 44 %
Lymphs Abs: 3.8 10*3/uL (ref 0.7–4.0)
MCH: 29.5 pg (ref 26.0–34.0)
MCHC: 34 g/dL (ref 30.0–36.0)
MCV: 86.9 fL (ref 78.0–100.0)
Monocytes Absolute: 0.5 10*3/uL (ref 0.1–1.0)
Monocytes Relative: 5 %
Neutro Abs: 4 10*3/uL (ref 1.7–7.7)
Neutrophils Relative %: 48 %
PLATELETS: 284 10*3/uL (ref 150–400)
RBC: 4.64 MIL/uL (ref 3.87–5.11)
RDW: 14.6 % (ref 11.5–15.5)
WBC: 8.6 10*3/uL (ref 4.0–10.5)

## 2017-05-14 LAB — BASIC METABOLIC PANEL
Anion gap: 10 (ref 5–15)
BUN: 10 mg/dL (ref 6–20)
CALCIUM: 9.2 mg/dL (ref 8.9–10.3)
CO2: 24 mmol/L (ref 22–32)
CREATININE: 0.8 mg/dL (ref 0.44–1.00)
Chloride: 105 mmol/L (ref 101–111)
GFR calc Af Amer: >60 mL/min (ref >60)
Glucose, Bld: 103 mg/dL — ABNORMAL HIGH (ref 65–99)
Potassium: 3.9 mmol/L (ref 3.5–5.1)
SODIUM: 139 mmol/L (ref 135–145)

## 2017-05-14 MED ORDER — MECLIZINE HCL 25 MG PO TABS: 25.0000 mg | ORAL_TABLET | Freq: Three times a day (TID) | ORAL | 0 refills | Status: DC | PRN

## 2017-05-14 NOTE — ED Notes (Signed)
Patient ambulated to the bathroom independently with no assistance needed. No c/o of dizziness. Meal given at this time

## 2017-05-14 NOTE — ED Notes (Signed)
PT states understanding of care given, follow up care, and medication prescribed. PT ambulated from ED to car with a steady gait. 

## 2017-05-14 NOTE — Discharge Instructions (Signed)
You may alternate Tylenol 1000 mg every 6 hours as needed for pain and Ibuprofen 800 mg every 8 hours as needed for pain.  Please take Ibuprofen with food. ° ° ° °To find a primary care or specialty doctor please call [phone] or [phone] to access "[facility] Find a Doctor Service." ° °You may also go on the [facility] website at [email or web address] ° °There are also multiple [facility], [facility] and [facility] practices throughout the [place] that are frequently accepting new patients. You may find a clinic that is close to your home and contact them. ° °[facility] -  °[address] °[place] [address] °[phone] ° ° °[facility] -  °[address] °[place] [address] °[phone] ° ° °[facility] - °[address]  °[place] [address] °[phone] ° ° °

## 2017-07-25 ENCOUNTER — Encounter (HOSPITAL_COMMUNITY): Payer: Self-pay

## 2017-07-25 ENCOUNTER — Emergency Department (HOSPITAL_COMMUNITY)
Admission: EM | Admit: 2017-07-25 | Discharge: 2017-07-26 | Disposition: A | Discharge status: Home / Self Care | Payer: Self-pay | Attending: Emergency Medicine | Admitting: Emergency Medicine

## 2017-07-25 DIAGNOSIS — N3090 Cystitis, unspecified without hematuria: Secondary | ICD-10-CM | POA: Insufficient documentation

## 2017-07-25 DIAGNOSIS — F17200 Nicotine dependence, unspecified, uncomplicated: Secondary | ICD-10-CM | POA: Insufficient documentation

## 2017-07-25 DIAGNOSIS — N12 Tubulo-interstitial nephritis, not specified as acute or chronic: Secondary | ICD-10-CM | POA: Insufficient documentation

## 2017-07-25 LAB — COMPREHENSIVE METABOLIC PANEL
ALBUMIN: 4 g/dL (ref 3.5–5.0)
ALK PHOS: 81 U/L (ref 38–126)
ALT: 14 U/L (ref 14–54)
ANION GAP: 9 (ref 5–15)
AST: 16 U/L (ref 15–41)
BUN: 15 mg/dL (ref 6–20)
CHLORIDE: 107 mmol/L (ref 101–111)
CO2: 23 mmol/L (ref 22–32)
CREATININE: 1.13 mg/dL — AB (ref 0.44–1.00)
Calcium: 9.7 mg/dL (ref 8.9–10.3)
GFR calc Af Amer: >60 mL/min (ref >60)
GFR calc non Af Amer: 59 mL/min — ABNORMAL LOW (ref >60)
GLUCOSE: 127 mg/dL — AB (ref 65–99)
Potassium: 3.9 mmol/L (ref 3.5–5.1)
SODIUM: 139 mmol/L (ref 135–145)
Total Bilirubin: 0.7 mg/dL (ref 0.3–1.2)
Total Protein: 7.2 g/dL (ref 6.5–8.1)

## 2017-07-25 LAB — I-STAT BETA HCG BLOOD, ED (MC, WL, AP ONLY)

## 2017-07-25 LAB — URINALYSIS, ROUTINE W REFLEX MICROSCOPIC
Bacteria, UA: NONE SEEN
Bilirubin Urine: NEGATIVE
Glucose, UA: NEGATIVE mg/dL
Ketones, ur: NEGATIVE mg/dL
Nitrite: NEGATIVE
PROTEIN: 100 mg/dL — AB
SPECIFIC GRAVITY, URINE: 1.018 (ref 1.005–1.030)
pH: 6 (ref 5.0–8.0)

## 2017-07-25 LAB — CBC
HCT: 38.8 % (ref 36.0–46.0)
HEMOGLOBIN: 13 g/dL (ref 12.0–15.0)
MCH: 28.9 pg (ref 26.0–34.0)
MCHC: 33.5 g/dL (ref 30.0–36.0)
MCV: 86.2 fL (ref 78.0–100.0)
Platelets: 331 10*3/uL (ref 150–400)
RBC: 4.5 MIL/uL (ref 3.87–5.11)
RDW: 13.7 % (ref 11.5–15.5)
WBC: 15.3 10*3/uL — ABNORMAL HIGH (ref 4.0–10.5)

## 2017-07-25 LAB — LIPASE, BLOOD: LIPASE: 33 U/L (ref 11–51)

## 2017-07-25 MED ORDER — MORPHINE SULFATE (PF) 4 MG/ML IV SOLN
4.0000 mg | Freq: Once | INTRAVENOUS | Status: AC
Start: 2017-07-26 — End: 2017-07-26
  Administered 2017-07-26: 4 mg via INTRAVENOUS
  Filled 2017-07-25: qty 1

## 2017-07-25 MED ORDER — ONDANSETRON HCL 4 MG/2ML IJ SOLN
4.0000 mg | Freq: Once | INTRAMUSCULAR | Status: AC
  Administered 2017-07-26: 4 mg via INTRAVENOUS
  Filled 2017-07-25: qty 2

## 2017-07-25 NOTE — ED Triage Notes (Signed)
Pt reports generalized lower abdominal pain, dysuria, and lower back pain x 3 days. Pt endorses n/v.

## 2017-07-26 ENCOUNTER — Emergency Department (HOSPITAL_COMMUNITY): Payer: Self-pay

## 2017-07-26 MED ORDER — PHENAZOPYRIDINE HCL 200 MG PO TABS: 200.0000 mg | ORAL_TABLET | Freq: Three times a day (TID) | ORAL | 0 refills | Status: DC

## 2017-07-26 MED ORDER — IOPAMIDOL (ISOVUE-300) INJECTION 61%
100.0000 mL | Freq: Once | INTRAVENOUS | Status: AC | PRN
  Administered 2017-07-26: 100 mL via INTRAVENOUS

## 2017-07-26 MED ORDER — IBUPROFEN 600 MG PO TABS: 600.0000 mg | ORAL_TABLET | Freq: Four times a day (QID) | ORAL | 0 refills | Status: DC | PRN

## 2017-07-26 MED ORDER — CEPHALEXIN 500 MG PO CAPS: 500.0000 mg | ORAL_CAPSULE | Freq: Four times a day (QID) | ORAL | 0 refills | Status: AC

## 2017-07-26 MED ORDER — SODIUM CHLORIDE 0.9 % IV SOLN
1.0000 g | Freq: Once | INTRAVENOUS | Status: AC
  Administered 2017-07-26: 1 g via INTRAVENOUS
  Filled 2017-07-26: qty 10

## 2017-07-26 MED ORDER — HYDROMORPHONE HCL 2 MG/ML IJ SOLN
0.5000 mg | Freq: Once | INTRAMUSCULAR | Status: AC
  Administered 2017-07-26: 0.5 mg via INTRAVENOUS
  Filled 2017-07-26: qty 1

## 2017-07-26 MED ORDER — IOPAMIDOL (ISOVUE-300) INJECTION 61%
INTRAVENOUS | Status: AC
  Filled 2017-07-26: qty 100

## 2017-07-26 NOTE — Discharge Instructions (Addendum)
Take medications as prescribed. Return to the emergency department with any high fever, severe pain, uncontrolled vomiting or new concern.

## 2017-07-26 NOTE — ED Provider Notes (Signed)
[facility] EMERGENCY DEPARTMENT Provider Note   CSN: [id number] Arrival date & time: 07/25/17  1825     History   Chief Complaint Chief Complaint  Patient presents with  . Abdominal Pain  . Dysuria    HPI Casey Adams is a 42 y.o. female.  Patient presents with lower abdominal pain, radiates across lower back, that started 3-4 days ago. She is not aware of any fever. The pain is associated with nausea and vomiting. No change in bowel movements. She reports significant increase to the pain with urination but denies difficulty emptying bladder, hematuria or frequency. No chest pain, new cough, congestion.   The history is provided by the patient. No language interpreter was used.  Abdominal Pain   Associated symptoms include nausea, vomiting and dysuria. Pertinent negatives include fever, frequency and hematuria.  Dysuria   Associated symptoms include nausea and vomiting. Pertinent negatives include no chills, no frequency, no hematuria and no flank pain.    Past Medical History:  Diagnosis Date  . Abnormal Pap smear    2010  . Bilateral ovarian cysts   . Bipolar 1 disorder (HCC)   . History of stomach ulcers   . Hypertension     Patient Active Problem List   Diagnosis Date Noted  . Pelvic pain in female 09/05/2014  . Acute appendicitis 01/09/2014    Past Surgical History:  Procedure Laterality Date  . CESAREAN SECTION    . LAPAROSCOPIC APPENDECTOMY N/A 01/09/2014   Procedure: APPENDECTOMY LAPAROSCOPIC;  Surgeon: Claud Kelp, MD;  Location: [facility] OR;  Service: General;  Laterality: N/A;  . TUBAL LIGATION       OB History    Gravida  4   Para  4   Term  3   Preterm  1   AB      Living  4     SAB      TAB      Ectopic      Multiple      Live Births  4            Home Medications    Prior to Admission medications   Medication Sig Start Date End Date Taking? Authorizing Provider  meclizine (ANTIVERT) 25 MG tablet  Take 1 tablet (25 mg total) by mouth 3 (three) times daily as needed for dizziness. 05/14/17   Ward, Layla Maw, DO    Family History Family History  Problem Relation Age of Onset  . Heart disease Mother   . Hypertension Mother   . Miscarriages / [place] Mother   . Heart disease Father   . Hypertension Father   . Heart disease Maternal Grandmother   . Cancer Paternal Grandmother   . Cancer Paternal Grandfather     Social History Social History   Tobacco Use  . Smoking status: Current Every Day Smoker    Packs/day: 1.00  . Smokeless tobacco: Never Used  Substance Use Topics  . Alcohol use: No  . Drug use: No     Allergies   Patient has no known allergies.   Review of Systems Review of Systems  Constitutional: Negative for chills and fever.  HENT: Negative.   Respiratory: Negative.   Cardiovascular: Negative.   Gastrointestinal: Positive for abdominal pain, nausea and vomiting.  Genitourinary: Positive for dysuria. Negative for difficulty urinating, flank pain, frequency and hematuria.  Musculoskeletal: Positive for back pain.  Skin: Negative.   Neurological: Negative.      Physical Exam Updated  Vital Signs BP (!) 183/107 (BP Location: Left Wrist)   Pulse 85   Temp 98.7 F (37.1 C) (Oral)   Resp 20   Ht  (1.6 m)   Wt 72.6 kg (160 lb)   SpO2 98%   BMI 28.34 kg/m   Physical Exam  Constitutional: She appears well-developed and well-nourished.  HENT:  Head: Normocephalic.  Neck: Normal range of motion. Neck supple.  Cardiovascular: Normal rate and regular rhythm.  Pulmonary/Chest: Effort normal and breath sounds normal.  Abdominal: Soft. Bowel sounds are normal. There is tenderness in the right lower quadrant, suprapubic area and left lower quadrant. There is guarding (Mild). There is no rebound.  Musculoskeletal: Normal range of motion.  Neurological: She is alert. No cranial nerve deficit.  Skin: Skin is warm and dry. Rash (subacute generalized  rash consisting of singular erythematous nonraised areas approximately 3-4 mm. There is variable scabbing.) noted.  Psychiatric: She has a normal mood and affect.     ED Treatments / Results  Labs (all labs ordered are listed, but only abnormal results are displayed) Labs Reviewed  COMPREHENSIVE METABOLIC PANEL - Abnormal; Notable for the following components:      Result Value   Glucose, Bld 127 (*)    Creatinine, Ser 1.13 (*)    GFR calc non Af Amer 59 (*)    All other components within normal limits  CBC - Abnormal; Notable for the following components:   WBC 15.3 (*)    All other components within normal limits  URINALYSIS, ROUTINE W REFLEX MICROSCOPIC - Abnormal; Notable for the following components:   APPearance CLOUDY (*)    Hgb urine dipstick MODERATE (*)    Protein, ur 100 (*)    Leukocytes, UA LARGE (*)    RBC / HPF >50 (*)    WBC, UA >50 (*)    All other components within normal limits  LIPASE, BLOOD  I-STAT BETA HCG BLOOD, ED ([facility], [facility], [facility] ONLY)    EKG None  Radiology No results found. Ct Abdomen Pelvis W Contrast  Result Date: 07/26/2017 CLINICAL DATA:  Generalized lower abdominal pain, dysuria, and low back pain for 3 days. Nausea and vomiting. EXAM: CT ABDOMEN AND PELVIS WITH CONTRAST TECHNIQUE: Multidetector CT imaging of the abdomen and pelvis was performed using the standard protocol following bolus administration of intravenous contrast. CONTRAST:  ISOVUE-300 IOPAMIDOL (ISOVUE-300) INJECTION 61% COMPARISON:  01/09/2014 FINDINGS: Lower chest: Mild dependent changes in the lung bases. Hepatobiliary: No focal liver abnormality is seen. Status post cholecystectomy. No biliary dilatation. Pancreas: Unremarkable. No pancreatic ductal dilatation or surrounding inflammatory changes. Spleen: Normal in size without focal abnormality. Adrenals/Urinary Tract: Renal nephrograms are symmetrical. Multiple intrarenal stones in the right kidney, largest measuring about 3 mm  diameter. There is no hydronephrosis or hydroureter but the right ureter appears to be thickened and there is mild stranding around the right ureter. Left ureter is also thick walled and stranded. This likely indicates pyelonephritis. Bladder wall is mildly thickened suggesting cystitis. No bladder stones or filling defects. Small parapelvic cysts on the left kidney. Stomach/Bowel: Stomach is within normal limits. Appendix is surgically absent. No evidence of bowel wall thickening, distention, or inflammatory changes. Vascular/Lymphatic: Aortic atherosclerosis. No enlarged abdominal or pelvic lymph nodes. Reproductive: Uterus and bilateral adnexa are unremarkable. Other: No free air or free fluid in the abdomen. Abdominal wall musculature appears intact. Musculoskeletal: No acute or significant osseous findings. IMPRESSION: 1. Multiple nonobstructing intrarenal stones in the right kidney. 2.  Thickening of the walls of the collecting systems and ureters bilaterally with thickening of the bladder wall. Changes suggest cystitis and pyelonephritis. Nephrograms are symmetrical. 3. Surgical absence of the gallbladder and appendix. 4. Aortic atherosclerosis. Electronically Signed   By: Burman Nieves M.D.   On: 07/26/2017 01:33    Procedures Procedures (including critical care time)  Medications Ordered in ED Medications  morphine 4 MG/ML injection 4 mg (has no administration in time range)  ondansetron (ZOFRAN) injection 4 mg (has no administration in time range)     Initial Impression / Assessment and Plan / ED Course  I have reviewed the triage vital signs and the nursing notes.  Pertinent labs & imaging results that were available during my care of the patient were reviewed by me and considered in my medical decision making (see chart for details).     Patient with lower abdominal pain, not feeling well, N, V and painful urination without evidence of UA on lab results. Feel CT is appropriate for  further evaluation.  CT scan is c/w cystitis and pyelonephritis. Pain is controlled in the ED. IV Rocephin provided. No vomiting here. She is drinking fluids without difficulty.  Feel she is appropriate for discharge home with Rx Keflex. REturn precautions discussed.   Final Clinical Impressions(s) / ED Diagnoses   Final diagnoses:  None   1. Cystitis 2. Pyelonephritis  ED Discharge Orders    None       Elpidio Anis, PA-C 07/27/17 1610    Geoffery Lyons, MD 07/27/17 [phone]

## 2017-09-12 LAB — GLUCOSE, POCT (MANUAL RESULT ENTRY): POC Glucose: 84 mg/dl (ref 70–99)

## 2018-01-16 ENCOUNTER — Encounter (HOSPITAL_COMMUNITY): Payer: Self-pay | Admitting: Student

## 2018-01-16 ENCOUNTER — Other Ambulatory Visit: Payer: Self-pay

## 2018-01-16 ENCOUNTER — Emergency Department (HOSPITAL_COMMUNITY)
Admission: EM | Admit: 2018-01-16 | Discharge: 2018-01-17 | Disposition: A | Discharge status: Home / Self Care | Payer: Self-pay | Attending: Emergency Medicine | Admitting: Emergency Medicine

## 2018-01-16 DIAGNOSIS — N3001 Acute cystitis with hematuria: Secondary | ICD-10-CM | POA: Insufficient documentation

## 2018-01-16 DIAGNOSIS — I10 Essential (primary) hypertension: Secondary | ICD-10-CM | POA: Insufficient documentation

## 2018-01-16 DIAGNOSIS — K529 Noninfective gastroenteritis and colitis, unspecified: Secondary | ICD-10-CM | POA: Insufficient documentation

## 2018-01-16 DIAGNOSIS — F17200 Nicotine dependence, unspecified, uncomplicated: Secondary | ICD-10-CM | POA: Insufficient documentation

## 2018-01-16 DIAGNOSIS — Z79899 Other long term (current) drug therapy: Secondary | ICD-10-CM | POA: Insufficient documentation

## 2018-01-16 LAB — COMPREHENSIVE METABOLIC PANEL
ALBUMIN: 4.1 g/dL (ref 3.5–5.0)
ALK PHOS: 69 U/L (ref 38–126)
ALT: 15 U/L (ref 0–44)
AST: 18 U/L (ref 15–41)
Anion gap: 11 (ref 5–15)
BUN: 9 mg/dL (ref 6–20)
CALCIUM: 9.8 mg/dL (ref 8.9–10.3)
CO2: 22 mmol/L (ref 22–32)
Chloride: 105 mmol/L (ref 98–111)
Creatinine, Ser: 0.81 mg/dL (ref 0.44–1.00)
GFR calc Af Amer: >60 mL/min (ref >60)
GFR calc non Af Amer: >60 mL/min (ref >60)
GLUCOSE: 111 mg/dL — AB (ref 70–99)
Potassium: 3.5 mmol/L (ref 3.5–5.1)
SODIUM: 138 mmol/L (ref 135–145)
TOTAL PROTEIN: 7.6 g/dL (ref 6.5–8.1)
Total Bilirubin: 0.7 mg/dL (ref 0.3–1.2)

## 2018-01-16 LAB — URINALYSIS, ROUTINE W REFLEX MICROSCOPIC
BILIRUBIN URINE: NEGATIVE
GLUCOSE, UA: NEGATIVE mg/dL
KETONES UR: 5 mg/dL — AB
Nitrite: POSITIVE — AB
PH: 5 (ref 5.0–8.0)
Protein, ur: NEGATIVE mg/dL
Specific Gravity, Urine: 1.017 (ref 1.005–1.030)
WBC, UA: >50 WBC/hpf — ABNORMAL HIGH (ref 0–5)

## 2018-01-16 LAB — CBC
HEMATOCRIT: 41.6 % (ref 36.0–46.0)
HEMOGLOBIN: 13.4 g/dL (ref 12.0–15.0)
MCH: 27.5 pg (ref 26.0–34.0)
MCHC: 32.2 g/dL (ref 30.0–36.0)
MCV: 85.2 fL (ref 80.0–100.0)
PLATELETS: 323 10*3/uL (ref 150–400)
RBC: 4.88 MIL/uL (ref 3.87–5.11)
RDW: 13.7 % (ref 11.5–15.5)
WBC: 11.4 10*3/uL — ABNORMAL HIGH (ref 4.0–10.5)
nRBC: 0 % (ref 0.0–0.2)

## 2018-01-16 LAB — LIPASE, BLOOD: LIPASE: 31 U/L (ref 11–51)

## 2018-01-16 LAB — I-STAT BETA HCG BLOOD, ED (MC, WL, AP ONLY): I-stat hCG, quantitative: <5 m[IU]/mL (ref <5)

## 2018-01-16 MED ORDER — DICYCLOMINE HCL 10 MG/ML IM SOLN
20.0000 mg | Freq: Once | INTRAMUSCULAR | Status: AC
  Administered 2018-01-16: 20 mg via INTRAMUSCULAR
  Filled 2018-01-16: qty 2

## 2018-01-16 MED ORDER — SODIUM CHLORIDE 0.9 % IV BOLUS
1000.0000 mL | Freq: Once | INTRAVENOUS | Status: AC
  Administered 2018-01-16: 1000 mL via INTRAVENOUS

## 2018-01-16 MED ORDER — ONDANSETRON HCL 4 MG/2ML IJ SOLN
4.0000 mg | Freq: Once | INTRAMUSCULAR | Status: AC
  Administered 2018-01-16: 4 mg via INTRAVENOUS
  Filled 2018-01-16: qty 2

## 2018-01-16 NOTE — ED Notes (Signed)
Results reviewed.  No changes in acuity at this time 

## 2018-01-16 NOTE — ED Notes (Signed)
RN Inetta Fermo aware of BP

## 2018-01-16 NOTE — ED Provider Notes (Signed)
[facility] EMERGENCY DEPARTMENT Provider Note   CSN: [id number] Arrival date & time: 01/16/18  1907     History   Chief Complaint Chief Complaint  Patient presents with  . N/V/D    HPI Casey Adams is a 42 y.o. female with a hx of tobacco abuse, HTN, bipolar 1 disorder, bilateral ovarian cysts, gastric ulcers, s/p appendectomy, s/p cholecystectomy, s/p tubal ligation, s/p c-section who presents to the ED with complaints of abdominal pain for the past 1 week.  She describes the pain as being to the periumbilical/lower abdomen.  States the pain is intermittent, can occur following a bowel movement without any specific triggers, seems to last about an hour prior to spontaneously resolving.  States the pain is a cramping sensation.  She is currently having the discomfort and rates it a 5 out of 10 in severity.  She states that over the past 3 to 4 days she has developed watery diarrhea which is nonbloody.  She states she has had about 3-4 episodes per day.  She also reports that over the past week she has had overall decreased PO Intake and that this morning she began to feel nauseous with one episode of nonbloody emesis.  She has not had an appetite to attempt to tolerate oral fluids today. She reports urinary frequency and urgency as well with some intermittent back pain when the abdominal pain occurs (bilateral). Denies fever, chills, chest pain, dyspnea, vaginal bleeding, or vaginal discharge. She is sexually active in a monogamous relationship, does not use protection, she is not concerned for STDs. Denies increased NSAIDs or EtOH use. She states this does not feel similar to prior issues with ulcers.    She is prescribed anti-hypertensive medication and is non compliant.   HPI  Past Medical History:  Diagnosis Date  . Abnormal Pap smear    2010  . Bilateral ovarian cysts   . Bipolar 1 disorder (HCC)   . History of stomach ulcers   . Hypertension     Patient  Active Problem List   Diagnosis Date Noted  . Pelvic pain in female 09/05/2014  . Acute appendicitis 01/09/2014    Past Surgical History:  Procedure Laterality Date  . CESAREAN SECTION    . LAPAROSCOPIC APPENDECTOMY N/A 01/09/2014   Procedure: APPENDECTOMY LAPAROSCOPIC;  Surgeon: Claud Kelp, MD;  Location: [facility] OR;  Service: General;  Laterality: N/A;  . TUBAL LIGATION       OB History    Gravida  4   Para  4   Term  3   Preterm  1   AB      Living  4     SAB      TAB      Ectopic      Multiple      Live Births  4            Home Medications    Prior to Admission medications   Medication Sig Start Date End Date Taking? Authorizing Provider  ibuprofen (ADVIL,MOTRIN) 600 MG tablet Take 1 tablet (600 mg total) by mouth every 6 (six) hours as needed. 07/26/17   Elpidio Anis, PA-C  meclizine (ANTIVERT) 25 MG tablet Take 1 tablet (25 mg total) by mouth 3 (three) times daily as needed for dizziness. 05/14/17   Ward, Layla Maw, DO  phenazopyridine (PYRIDIUM) 200 MG tablet Take 1 tablet (200 mg total) by mouth 3 (three) times daily. 07/26/17   Elpidio Anis, PA-C  Family History Family History  Problem Relation Age of Onset  . Heart disease Mother   . Hypertension Mother   . Miscarriages / [place] Mother   . Heart disease Father   . Hypertension Father   . Heart disease Maternal Grandmother   . Cancer Paternal Grandmother   . Cancer Paternal Grandfather     Social History Social History   Tobacco Use  . Smoking status: Current Every Day Smoker    Packs/day: 1.00  . Smokeless tobacco: Never Used  Substance Use Topics  . Alcohol use: No  . Drug use: No     Allergies   Patient has no known allergies.   Review of Systems Review of Systems  Constitutional: Positive for appetite change. Negative for chills and fever.  Respiratory: Negative for shortness of breath.   Cardiovascular: Negative for chest pain.  Gastrointestinal: Positive  for abdominal pain, diarrhea, nausea and vomiting. Negative for anal bleeding, blood in stool and constipation.  Genitourinary: Positive for frequency and urgency. Negative for dysuria, vaginal bleeding and vaginal discharge.  Neurological: Negative for dizziness, syncope, weakness, numbness and headaches.  All other systems reviewed and are negative.    Physical Exam Updated Vital Signs BP (!) 193/115 (BP Location: Left Arm)   Pulse 99   Temp 98.5 F (36.9 C) (Oral)   Resp 18   Ht 5\' 3"  (1.6 m)   Wt 75 kg   SpO2 100%   BMI 29.29 kg/m   Physical Exam  Constitutional: She appears well-developed and well-nourished.  Non-toxic appearance. No distress.  HENT:  Head: Normocephalic and atraumatic.  Eyes: Conjunctivae are normal. Right eye exhibits no discharge. Left eye exhibits no discharge.  Neck: Neck supple.  Cardiovascular: Normal rate and regular rhythm.  Pulmonary/Chest: Effort normal and breath sounds normal. No respiratory distress. She has no wheezes. She has no rhonchi. She has no rales.  Respiration even and unlabored  Abdominal: Soft. Bowel sounds are normal. She exhibits no distension. There is tenderness (periumbilical and lower abdomen, mild). There is no rigidity, no rebound, no guarding and no CVA tenderness.  Genitourinary:  Genitourinary Comments: Patient deferred exam.   Neurological: She is alert.  Clear speech.   Skin: Skin is warm and dry. No rash noted.  Psychiatric: She has a normal mood and affect. Her behavior is normal.  Nursing note and vitals reviewed.  ED Treatments / Results  Labs (all labs ordered are listed, but only abnormal results are displayed) Labs Reviewed  COMPREHENSIVE METABOLIC PANEL - Abnormal; Notable for the following components:      Result Value   Glucose, Bld 111 (*)    All other components within normal limits  CBC - Abnormal; Notable for the following components:   WBC 11.4 (*)    All other components within normal limits    URINALYSIS, ROUTINE W REFLEX MICROSCOPIC - Abnormal; Notable for the following components:   APPearance HAZY (*)    Hgb urine dipstick MODERATE (*)    Ketones, ur 5 (*)    Nitrite POSITIVE (*)    Leukocytes, UA SMALL (*)    RBC / HPF >50 (*)    WBC, UA >50 (*)    Bacteria, UA MANY (*)    All other components within normal limits  URINE CULTURE  LIPASE, BLOOD  I-STAT BETA HCG BLOOD, ED ([facility], [facility], [facility] ONLY)  I-STAT TROPONIN, ED  I-STAT TROPONIN, ED  I-STAT TROPONIN, ED    EKG EKG Interpretation  Date/Time:  Sunday  January 17 2018 03:22:08 EDT Ventricular Rate:  63 PR Interval:    QRS Duration: 73 QT Interval:  446 QTC Calculation: 457 R Axis:   23 Text Interpretation:  Sinus rhythm Abnormal R-wave progression, early transition No acute changes No significant change since last tracing Confirmed by Derwood Kaplan [phone]) on 01/17/2018 6:25:35 AM   Radiology Dg Abdomen Acute W/chest  Result Date: 01/17/2018 CLINICAL DATA:  Abdominal pain nausea and vomiting. EXAM: DG ABDOMEN ACUTE W/ 1V CHEST COMPARISON:  07/26/2017 CT FINDINGS: There is no evidence of dilated bowel loops or free intraperitoneal air. Right-sided renal calculi measuring up to 4 mm are identified projecting over the mid to lower pole of the right kidney. There may also be an additional right ureteral calculus versus sclerotic appearance of the right L3 transverse process tip simulating a stone (given similar appearance to L1, suspect this is related to the transverse process). Tubal ligation clips are seen in the left hemipelvis. Heart size and mediastinal contours are within normal limits. Both lungs are clear. IMPRESSION: Right-sided nephrolithiasis measuring up to 4 mm. Equivocal proximal ureteral stone versus sclerotic appearance of the tip of the right L3 transverse process simulating a renal stone (given similar appearance of the right L1 transverse process suspect this is related to slight sclerosis of the tip of  the right L3 transverse process and less likely ureteral stone. If there are clinical findings suggestive of right flank pain CT without IV contrast may help. Nonobstructive bowel gas pattern. Electronically Signed   By: Tollie Eth M.D.   On: 01/17/2018 00:31   Ct Renal Stone Study  Result Date: 01/17/2018 CLINICAL DATA:  Lower abdominal pain with nausea, vomiting and diarrhea x1 week. EXAM: CT ABDOMEN AND PELVIS WITHOUT CONTRAST TECHNIQUE: Multidetector CT imaging of the abdomen and pelvis was performed following the standard protocol without IV contrast. COMPARISON:  Same day radiographs of the abdomen. CT from 07/26/2017 FINDINGS: Lower chest: No acute abnormality.  Bibasilar dependent atelectasis. Hepatobiliary: No focal liver abnormality is seen. Status post cholecystectomy. No biliary dilatation. Pancreas: Unremarkable. No pancreatic ductal dilatation or surrounding inflammatory changes. Spleen: Normal in size without focal abnormality. Adrenals/Urinary Tract: Normal bilateral adrenal glands. Redemonstration of multiple right-sided renal calculi without hydroureteronephrosis nor ureteric calculi. Left kidney is unremarkable. The largest stone measures up to 4 mm in the interpolar aspect. No bladder calculus. Stomach/Bowel: Small hiatal hernia. Decompressed stomach. Normal small bowel rotation. Mild fluid-filled distention of small bowel without mechanical bowel obstruction compatible with small bowel enteritis. Appendectomy. Large bowel is unremarkable. Vascular/Lymphatic: Atherosclerosis of the abdominal aorta. No lymphadenopathy. Reproductive: Tubal ligation clips are noted. Uterus is unremarkable. No adnexal mass. Other: No free air nor free fluid. Musculoskeletal: No acute osseous abnormality. IMPRESSION: 1. Mild fluid-filled distention of small bowel loops without mechanical bowel obstruction. Findings suggest enteritis. 2. Uncomplicated right-sided nephrolithiasis. No obstructive uropathy.  Electronically Signed   By: Tollie Eth M.D.   On: 01/17/2018 02:54    Procedures Procedures (including critical care time)  Medications Ordered in ED Medications - No data to display   Initial Impression / Assessment and Plan / ED Course  I have reviewed the triage vital signs and the nursing notes.  Pertinent labs & imaging results that were available during my care of the patient were reviewed by me and considered in my medical decision making (see chart for details).   Patient presents to the emergency department with abdominal pain and resultant nausea, vomiting, and diarrhea.  She has also  had some urinary frequency/urgency.  Patient nontoxic-appearing, no apparent distress, vitals without significant abnormality, elevated blood pressure-noncompliant with antihypertensives, improved throughout ED visit, doubt HTN emergency.  On exam patient has periumbilical and lower abdominal tenderness without peritoneal signs.   Labs per triage team have been reviewed: Nonspecific leukocytosis at 11.4.  No anemia.  No significant elect light disturbance.  LFTs, lipase, and renal function are within normal limits.  Urinalysis is concerning for infection-this has been sent for culture.   Initial treatment included Bentyl, Zofran, and IV fluids.  Chest/abdominal series x-ray ordered, no evidence of bowel obstruction, intrarenal nephrolithiasis present, however there is questionable finding of possible ureteral stone, given this would change patient's management feel that CT renal study is prudent.  01:30: RE-EVAL: Patient without significant change in her pain, morphine ordered, CT renal study pending.  We have additionally ordered Rocephin to initiate treatment for UTI/pyelo.   CT Renal Study without obstructive uropathy, there are findings consistent with enteritis. Likely combination of underlying etiology to patient's sxs UTI/Pyelonephritis with enteritis. Plan for treatment with Keflex, will cover  for pyelo given hx of vomiting with reports of back pain, enteritis likely viral. Zofran prescription for nausea.   03:30: RN informed me patient with complaint of chest pain.   03:35: RE-EVAL: Patient states she is having discomfort in the abdomen radiating to the chest that is a twisting sensation. She states she feels that this is due to receiving morphine and not having eaten all day. We will check an EKG and delta troponin given patient does have risk factors including Tobacco use and HTN. I offered GI cocktail, patient would only prefer to try to eat/drink something, I feel this is reasonable.  Patient tolerating PO, her pain has resolved- suspect patient concern for narcotics on empty stomach to be correct, EKG without significant change from prior tracing, delta troponin negative, doubt ACS.  Given symptoms and exam I have low suspicion for other acute pathology such as pulmonary embolism or dissection.  Patient states she is feeling much better and  is ready to go home.  She appears safe for discharge at this time.  Will provide prescriptions for Keflex and Zofran with PCP follow up. I discussed results, treatment plan, need for PCP follow-up, and return precautions with the patient. Provided opportunity for questions, patient confirmed understanding and is in agreement with plan.   Findings and plan of care discussed with supervising physician Dr. Rhunette Croft- in agreement.    Final Clinical Impressions(s) / ED Diagnoses   Final diagnoses:  Acute cystitis with hematuria  Enteritis    ED Discharge Orders         Ordered    cephALEXin (KEFLEX) 500 MG capsule  4 times daily     01/17/18 0712    ondansetron (ZOFRAN ODT) 4 MG disintegrating tablet  Every 8 hours PRN     01/17/18 0712           Petrucelli, Pleas Koch, PA-C 01/17/18 1610    Derwood Kaplan, MD 01/18/18 0413

## 2018-01-16 NOTE — ED Triage Notes (Signed)
Patient c/o N/V/D for a few days. Patient was prescribed BP meds but refuses to take.

## 2018-01-17 ENCOUNTER — Emergency Department (HOSPITAL_COMMUNITY): Payer: Self-pay

## 2018-01-17 LAB — I-STAT TROPONIN, ED
TROPONIN I, POC: 0 ng/mL (ref 0.00–0.08)
Troponin i, poc: 0 ng/mL (ref 0.00–0.08)
Troponin i, poc: 0 ng/mL (ref 0.00–0.08)

## 2018-01-17 MED ORDER — ONDANSETRON 4 MG PO TBDP: 4.0000 mg | ORAL_TABLET | Freq: Three times a day (TID) | ORAL | 0 refills | Status: DC | PRN

## 2018-01-17 MED ORDER — CEPHALEXIN 500 MG PO CAPS: 500.0000 mg | ORAL_CAPSULE | Freq: Four times a day (QID) | ORAL | 0 refills | Status: DC

## 2018-01-17 MED ORDER — SODIUM CHLORIDE 0.9 % IV SOLN
1.0000 g | Freq: Once | INTRAVENOUS | Status: AC
  Administered 2018-01-17: 1 g via INTRAVENOUS
  Filled 2018-01-17: qty 10

## 2018-01-17 MED ORDER — MORPHINE SULFATE (PF) 4 MG/ML IV SOLN
4.0000 mg | Freq: Once | INTRAVENOUS | Status: AC
  Administered 2018-01-17: 4 mg via INTRAVENOUS
  Filled 2018-01-17: qty 1

## 2018-01-17 MED ORDER — ACETAMINOPHEN 325 MG PO TABS: 650.0000 mg | ORAL_TABLET | Freq: Once | ORAL | Status: DC

## 2018-01-17 NOTE — ED Notes (Addendum)
Pt. Reports sudden onset of sharp central chest pain that radiated to back. EKG obtained and PA made aware

## 2018-01-17 NOTE — ED Notes (Signed)
Pt getting dressed.

## 2018-01-17 NOTE — ED Notes (Signed)
Pt stable, ambulatory, and verbalizes understanding of d/c instructions. Pt given bus pass.

## 2018-01-17 NOTE — ED Notes (Signed)
istat trop collected.

## 2018-01-17 NOTE — ED Notes (Signed)
Pt. To CT via stretcher. 

## 2018-01-17 NOTE — Discharge Instructions (Addendum)
You were seen in the ER today for abdominal pain with vomiting and diarrhea. Your work up here was overall reassuring Your urine appears infected consistent with a UTI and your CT scan shows signs of enteritis (likely a viral GI illness). We are prescribing you Keflex to treat the UTI please take this as prescribed, We are also prescribing you Zofran to help with any continued nausea. Please take tylenol should pain return.   We have prescribed you new medication(s) today. Discuss the medications prescribed today with your pharmacist as they can have adverse effects and interactions with your other medicines including over the counter and prescribed medications. Seek medical evaluation if you start to experience new or abnormal symptoms after taking one of these medicines, seek care immediately if you start to experience difficulty breathing, feeling of your throat closing, facial swelling, or rash as these could be indications of a more serious allergic reaction  Follow up with your primary care provider within 3 days for reevaluation.  Return to the ER for new or worsening symptoms including but not limited to fever, inability to keep fluids down, decreased urine output, or any other concerns that you may have.

## 2018-01-17 NOTE — ED Notes (Signed)
Pt. Ambulated to bathroom with steady gait.  

## 2018-01-19 LAB — URINE CULTURE: Culture: >=100000 — AB

## 2018-01-20 ENCOUNTER — Telehealth: Payer: Self-pay | Admitting: Emergency Medicine

## 2018-01-20 NOTE — Telephone Encounter (Signed)
Post ED Visit - Positive Culture Follow-up  Culture report reviewed by antimicrobial stewardship pharmacist:  []  Enzo Bi, Pharm.D. []  Celedonio Miyamoto, Pharm.D., BCPS AQ-ID []  Garvin Fila, Pharm.D., BCPS []  Georgina Pillion, Pharm.D., BCPS []  McGregor, [place].D., BCPS, AAHIVP []  Estella Husk, Pharm.D., BCPS, AAHIVP [x]  Lysle Pearl, PharmD, BCPS []  Phillips Climes, PharmD, BCPS []  Agapito Games, PharmD, BCPS []  Verlan Friends, PharmD  Positive urine culture Treated with cephalexin, organism sensitive to the same and no further patient follow-up is required at this time.  Berle Mull 01/20/2018, 9:46 AM

## 2018-04-10 ENCOUNTER — Emergency Department (HOSPITAL_COMMUNITY)
Admission: EM | Admit: 2018-04-10 | Discharge: 2018-04-11 | Disposition: A | Discharge status: Home / Self Care | Payer: Self-pay | Attending: Emergency Medicine | Admitting: Emergency Medicine

## 2018-04-10 ENCOUNTER — Other Ambulatory Visit: Payer: Self-pay

## 2018-04-10 ENCOUNTER — Encounter (HOSPITAL_COMMUNITY): Payer: Self-pay | Admitting: Emergency Medicine

## 2018-04-10 DIAGNOSIS — R350 Frequency of micturition: Secondary | ICD-10-CM | POA: Insufficient documentation

## 2018-04-10 DIAGNOSIS — R197 Diarrhea, unspecified: Secondary | ICD-10-CM | POA: Insufficient documentation

## 2018-04-10 DIAGNOSIS — F17210 Nicotine dependence, cigarettes, uncomplicated: Secondary | ICD-10-CM | POA: Insufficient documentation

## 2018-04-10 DIAGNOSIS — R300 Dysuria: Secondary | ICD-10-CM | POA: Insufficient documentation

## 2018-04-10 DIAGNOSIS — R112 Nausea with vomiting, unspecified: Secondary | ICD-10-CM | POA: Insufficient documentation

## 2018-04-10 LAB — COMPREHENSIVE METABOLIC PANEL
ALK PHOS: 61 U/L (ref 38–126)
ALT: 15 U/L (ref 0–44)
AST: 17 U/L (ref 15–41)
Albumin: 4.2 g/dL (ref 3.5–5.0)
Anion gap: 8 (ref 5–15)
BUN: 14 mg/dL (ref 6–20)
CALCIUM: 9.3 mg/dL (ref 8.9–10.3)
CHLORIDE: 106 mmol/L (ref 98–111)
CO2: 23 mmol/L (ref 22–32)
CREATININE: 0.87 mg/dL (ref 0.44–1.00)
Glucose, Bld: 101 mg/dL — ABNORMAL HIGH (ref 70–99)
Potassium: 3.6 mmol/L (ref 3.5–5.1)
Sodium: 137 mmol/L (ref 135–145)
Total Bilirubin: 0.3 mg/dL (ref 0.3–1.2)
Total Protein: 7 g/dL (ref 6.5–8.1)

## 2018-04-10 LAB — CBC
HEMATOCRIT: 40 % (ref 36.0–46.0)
Hemoglobin: 12.9 g/dL (ref 12.0–15.0)
MCH: 27.8 pg (ref 26.0–34.0)
MCHC: 32.3 g/dL (ref 30.0–36.0)
MCV: 86.2 fL (ref 80.0–100.0)
NRBC: 0 % (ref 0.0–0.2)
PLATELETS: 319 10*3/uL (ref 150–400)
RBC: 4.64 MIL/uL (ref 3.87–5.11)
RDW: 13.7 % (ref 11.5–15.5)
WBC: 11 10*3/uL — ABNORMAL HIGH (ref 4.0–10.5)

## 2018-04-10 LAB — I-STAT BETA HCG BLOOD, ED (MC, WL, AP ONLY): I-stat hCG, quantitative: <5 m[IU]/mL (ref <5)

## 2018-04-10 LAB — LIPASE, BLOOD: LIPASE: 36 U/L (ref 11–51)

## 2018-04-10 MED ORDER — KETOROLAC TROMETHAMINE 30 MG/ML IJ SOLN
30.0000 mg | Freq: Once | INTRAMUSCULAR | Status: AC
  Administered 2018-04-11: 30 mg via INTRAVENOUS
  Filled 2018-04-10: qty 1

## 2018-04-10 MED ORDER — ONDANSETRON HCL 4 MG/2ML IJ SOLN
4.0000 mg | Freq: Once | INTRAMUSCULAR | Status: AC
  Administered 2018-04-11: 4 mg via INTRAVENOUS
  Filled 2018-04-10: qty 2

## 2018-04-10 MED ORDER — SODIUM CHLORIDE 0.9 % IV BOLUS (SEPSIS)
1000.0000 mL | Freq: Once | INTRAVENOUS | Status: AC
  Administered 2018-04-11: 1000 mL via INTRAVENOUS

## 2018-04-10 MED ORDER — DICYCLOMINE HCL 10 MG/ML IM SOLN
20.0000 mg | Freq: Once | INTRAMUSCULAR | Status: AC
  Administered 2018-04-11: 20 mg via INTRAMUSCULAR
  Filled 2018-04-10: qty 2

## 2018-04-10 NOTE — ED Triage Notes (Signed)
C/O of pain that started in the hips and continued into the abdominal area. Pt states it got worse after she ate this afternoon. States: "it feels like one huge cramp."

## 2018-04-10 NOTE — ED Provider Notes (Signed)
TIME SEEN: 11:29 PM  CHIEF COMPLAINT: Abdominal pain  HPI: Patient is a 43 year old female with history of hypertension, ovarian cysts who presents to the emergency department with complaints of lower abdominal cramping that started at 8 PM and lasted approximately 1 hour.  Feels like her pain is starting to come back.  Worse after eating hibachi tonight.  She has had nausea and vomiting today and 2 to 3 days of diarrhea.  She is status post appendectomy, cholecystectomy, BTL, C-section.  She has had some dysuria and urinary frequency.  No vaginal bleeding or discharge.  Last menstrual period was the beginning of January.  Denies fever or chills.  No aggravating or alleviating factors other than feeling worse after eating.  ROS: See HPI Constitutional: no fever  Eyes: no drainage  ENT: no runny nose   Cardiovascular:  no chest pain  Resp: no SOB  GI:  vomiting GU:  dysuria Integumentary: no rash  Allergy: no hives  Musculoskeletal: no leg swelling  Neurological: no slurred speech ROS otherwise negative  PAST MEDICAL HISTORY/PAST SURGICAL HISTORY:  Past Medical History:  Diagnosis Date  . Abnormal Pap smear    2010  . Bilateral ovarian cysts   . Bipolar 1 disorder (HCC)   . History of stomach ulcers   . Hypertension     MEDICATIONS:  Prior to Admission medications   Medication Sig Start Date End Date Taking? Authorizing Provider  cephALEXin (KEFLEX) 500 MG capsule Take 1 capsule (500 mg total) by mouth 4 (four) times daily. 01/17/18   Petrucelli, Samantha R, PA-C  ondansetron (ZOFRAN ODT) 4 MG disintegrating tablet Take 1 tablet (4 mg total) by mouth every 8 (eight) hours as needed for nausea or vomiting. 01/17/18   Petrucelli, Pleas KochSamantha R, PA-C    ALLERGIES:  No Known Allergies  SOCIAL HISTORY:  Social History   Tobacco Use  . Smoking status: Current Every Day Smoker    Packs/day: 1.00  . Smokeless tobacco: Never Used  Substance Use Topics  . Alcohol use: No     FAMILY HISTORY: Family History  Problem Relation Age of Onset  . Heart disease Mother   . Hypertension Mother   . Miscarriages / [place] Mother   . Heart disease Father   . Hypertension Father   . Heart disease Maternal Grandmother   . Cancer Paternal Grandmother   . Cancer Paternal Grandfather     EXAM: BP (!) 156/94 (BP Location: Left Arm)   Pulse 85   Temp 98.4 F (36.9 C) (Oral)   Resp 16   Ht 5\' 3"  (1.6 m)   Wt 72.6 kg   SpO2 98%   BMI 28.34 kg/m  CONSTITUTIONAL: Alert and oriented and responds appropriately to questions. Well-appearing; well-nourished, afebrile HEAD: Normocephalic EYES: Conjunctivae clear, pupils appear equal, EOMI ENT: normal nose; moist mucous membranes NECK: Supple, no meningismus, no nuchal rigidity, no LAD  CARD: RRR; S1 and S2 appreciated; no murmurs, no clicks, no rubs, no gallops RESP: Normal chest excursion without splinting or tachypnea; breath sounds clear and equal bilaterally; no wheezes, no rhonchi, no rales, no hypoxia or respiratory distress, speaking full sentences ABD/GI: Normal bowel sounds; non-distended; soft, mildly tender to palpation throughout the abdomen, no rebound, no guarding, no peritoneal signs, no hepatosplenomegaly BACK:  The back appears normal and is non-tender to palpation, there is no CVA tenderness EXT: Normal ROM in all joints; non-tender to palpation; no edema; normal capillary refill; no cyanosis, no calf tenderness or swelling  SKIN: Normal color for age and race; warm; no rash NEURO: Moves all extremities equally PSYCH: The patient's mood and manner are appropriate. Grooming and personal hygiene are appropriate.  MEDICAL DECISION MAKING: Patient here with complaints of abdominal cramping, nausea, vomiting, diarrhea.  Suspect viral illness causing her symptoms.  Benign abdominal exam.  She is status post cholecystectomy and appendectomy.  Low suspicion for perforation, SBO.  She does have some urinary  complaints.  Will obtain urinalysis.  Labs today are unremarkable.  Pregnancy test negative.  Will treat symptoms with IV fluids, Zofran, Toradol, Bentyl and reassess.  ED PROGRESS: Patient reports feeling much better.  Able to drink without vomiting.  Urine shows no sign of infection.  I feel this is likely a viral illness causing her symptoms.  I feel she is safe to be discharged.  Will discharge with Zofran, Bentyl for symptomatic relief.  Recommended bland diet for the next several days.  Discussed return precautions.  She is comfortable with this plan.   At this time, I do not feel there is any life-threatening condition present. I have reviewed and discussed all results (EKG, imaging, lab, urine as appropriate) and exam findings with patient/family. I have reviewed nursing notes and appropriate previous records.  I feel the patient is safe to be discharged home without further emergent workup and can continue workup as an outpatient as needed. Discussed usual and customary return precautions. Patient/family verbalize understanding and are comfortable with this plan.  Outpatient follow-up has been provided as needed. All questions have been answered.      Kodi Guerrera, Layla MawKristen N, DO 04/11/18 0117

## 2018-04-11 LAB — URINALYSIS, ROUTINE W REFLEX MICROSCOPIC
BILIRUBIN URINE: NEGATIVE
Glucose, UA: NEGATIVE mg/dL
KETONES UR: NEGATIVE mg/dL
Leukocytes, UA: NEGATIVE
Nitrite: NEGATIVE
PH: 7 (ref 5.0–8.0)
PROTEIN: NEGATIVE mg/dL
Specific Gravity, Urine: 1.01 (ref 1.005–1.030)

## 2018-04-11 MED ORDER — ONDANSETRON 4 MG PO TBDP: 4.0000 mg | ORAL_TABLET | Freq: Four times a day (QID) | ORAL | 0 refills | Status: AC | PRN

## 2018-04-11 MED ORDER — DICYCLOMINE HCL 20 MG PO TABS: 20.0000 mg | ORAL_TABLET | Freq: Three times a day (TID) | ORAL | 0 refills | Status: DC | PRN

## 2018-04-11 NOTE — ED Notes (Signed)
Pt tolerating PO fluids

## 2018-04-11 NOTE — Discharge Instructions (Addendum)
You may alternate Tylenol 1000 mg every 6 hours as needed for pain and Ibuprofen 800 mg every 8 hours as needed for pain.  Please take Ibuprofen with food. ° °You may use over-the-counter Imodium as needed for diarrhea. °

## 2018-04-11 NOTE — ED Notes (Signed)
Urine culture sent to lab if needed 

## 2018-04-26 ENCOUNTER — Encounter (HOSPITAL_COMMUNITY): Payer: Self-pay | Admitting: Pharmacy Technician

## 2018-04-26 ENCOUNTER — Emergency Department (HOSPITAL_COMMUNITY)
Admission: EM | Admit: 2018-04-26 | Discharge: 2018-04-27 | Disposition: A | Discharge status: Home / Self Care | Payer: Self-pay | Attending: Emergency Medicine | Admitting: Emergency Medicine

## 2018-04-26 ENCOUNTER — Other Ambulatory Visit: Payer: Self-pay

## 2018-04-26 DIAGNOSIS — R112 Nausea with vomiting, unspecified: Secondary | ICD-10-CM | POA: Insufficient documentation

## 2018-04-26 DIAGNOSIS — R197 Diarrhea, unspecified: Secondary | ICD-10-CM | POA: Insufficient documentation

## 2018-04-26 DIAGNOSIS — F17200 Nicotine dependence, unspecified, uncomplicated: Secondary | ICD-10-CM | POA: Insufficient documentation

## 2018-04-26 DIAGNOSIS — R1010 Upper abdominal pain, unspecified: Secondary | ICD-10-CM

## 2018-04-26 DIAGNOSIS — R1013 Epigastric pain: Secondary | ICD-10-CM | POA: Insufficient documentation

## 2018-04-26 DIAGNOSIS — I10 Essential (primary) hypertension: Secondary | ICD-10-CM | POA: Insufficient documentation

## 2018-04-26 LAB — CBC WITH DIFFERENTIAL/PLATELET
Abs Immature Granulocytes: 0.03 10*3/uL (ref 0.00–0.07)
Basophils Absolute: 0 10*3/uL (ref 0.0–0.1)
Basophils Relative: 0 %
Eosinophils Absolute: 0.2 10*3/uL (ref 0.0–0.5)
Eosinophils Relative: 3 %
HCT: 41 % (ref 36.0–46.0)
Hemoglobin: 13.7 g/dL (ref 12.0–15.0)
Immature Granulocytes: 0 %
Lymphocytes Relative: 19 %
Lymphs Abs: 1.8 10*3/uL (ref 0.7–4.0)
MCH: 28.1 pg (ref 26.0–34.0)
MCHC: 33.4 g/dL (ref 30.0–36.0)
MCV: 84.2 fL (ref 80.0–100.0)
Monocytes Absolute: 0.6 10*3/uL (ref 0.1–1.0)
Monocytes Relative: 6 %
Neutro Abs: 6.5 10*3/uL (ref 1.7–7.7)
Neutrophils Relative %: 72 %
Platelets: 260 10*3/uL (ref 150–400)
RBC: 4.87 MIL/uL (ref 3.87–5.11)
RDW: 13.7 % (ref 11.5–15.5)
WBC: 9.1 10*3/uL (ref 4.0–10.5)
nRBC: 0 % (ref 0.0–0.2)

## 2018-04-26 LAB — COMPREHENSIVE METABOLIC PANEL
ALT: 17 U/L (ref 0–44)
AST: 18 U/L (ref 15–41)
Albumin: 3.8 g/dL (ref 3.5–5.0)
Alkaline Phosphatase: 61 U/L (ref 38–126)
Anion gap: 10 (ref 5–15)
BUN: 14 mg/dL (ref 6–20)
CO2: 20 mmol/L — ABNORMAL LOW (ref 22–32)
Calcium: 8.6 mg/dL — ABNORMAL LOW (ref 8.9–10.3)
Chloride: 106 mmol/L (ref 98–111)
Creatinine, Ser: 0.71 mg/dL (ref 0.44–1.00)
GFR calc Af Amer: >60 mL/min (ref >60)
GFR calc non Af Amer: >60 mL/min (ref >60)
Glucose, Bld: 94 mg/dL (ref 70–99)
Potassium: 3.3 mmol/L — ABNORMAL LOW (ref 3.5–5.1)
Sodium: 136 mmol/L (ref 135–145)
Total Bilirubin: 1.1 mg/dL (ref 0.3–1.2)
Total Protein: 7 g/dL (ref 6.5–8.1)

## 2018-04-26 LAB — LIPASE, BLOOD: Lipase: 29 U/L (ref 11–51)

## 2018-04-26 MED ORDER — MORPHINE SULFATE (PF) 4 MG/ML IV SOLN
6.0000 mg | Freq: Once | INTRAVENOUS | Status: AC
  Administered 2018-04-26: 6 mg via INTRAVENOUS
  Filled 2018-04-26: qty 2

## 2018-04-26 MED ORDER — POTASSIUM CHLORIDE CRYS ER 20 MEQ PO TBCR
60.0000 meq | EXTENDED_RELEASE_TABLET | Freq: Once | ORAL | Status: AC
  Administered 2018-04-26: 60 meq via ORAL
  Filled 2018-04-26: qty 3

## 2018-04-26 MED ORDER — SODIUM CHLORIDE 0.9 % IV BOLUS
1000.0000 mL | Freq: Once | INTRAVENOUS | Status: AC
  Administered 2018-04-26: 1000 mL via INTRAVENOUS

## 2018-04-26 MED ORDER — ONDANSETRON HCL 4 MG/2ML IJ SOLN
4.0000 mg | Freq: Once | INTRAMUSCULAR | Status: AC
  Administered 2018-04-26: 4 mg via INTRAVENOUS
  Filled 2018-04-26: qty 2

## 2018-04-26 MED ORDER — LOPERAMIDE HCL 2 MG PO CAPS
4.0000 mg | ORAL_CAPSULE | Freq: Once | ORAL | Status: AC
  Administered 2018-04-26: 4 mg via ORAL
  Filled 2018-04-26: qty 2

## 2018-04-26 NOTE — ED Triage Notes (Signed)
Pt arrives via EMS from home with reports of CP, abd pain, NVD onset yesterday. 160/108, HR 77, RR18, 97% RA. 324mg  ASA prior to arrival.

## 2018-04-27 LAB — URINALYSIS, ROUTINE W REFLEX MICROSCOPIC
Bilirubin Urine: NEGATIVE
Glucose, UA: NEGATIVE mg/dL
Ketones, ur: NEGATIVE mg/dL
Leukocytes, UA: NEGATIVE
Nitrite: NEGATIVE
Protein, ur: 100 mg/dL — AB
Specific Gravity, Urine: 1.027 (ref 1.005–1.030)
pH: 5 (ref 5.0–8.0)

## 2018-04-27 LAB — PREGNANCY, URINE: Preg Test, Ur: NEGATIVE

## 2018-04-27 MED ORDER — PROMETHAZINE HCL 25 MG/ML IJ SOLN
12.5000 mg | Freq: Once | INTRAMUSCULAR | Status: AC
  Administered 2018-04-27: 12.5 mg via INTRAVENOUS
  Filled 2018-04-27: qty 1

## 2018-04-27 MED ORDER — MORPHINE SULFATE (PF) 4 MG/ML IV SOLN
4.0000 mg | Freq: Once | INTRAVENOUS | Status: AC
  Administered 2018-04-27: 4 mg via INTRAVENOUS
  Filled 2018-04-27: qty 1

## 2018-04-27 MED ORDER — ONDANSETRON HCL 4 MG PO TABS: 4.0000 mg | ORAL_TABLET | Freq: Four times a day (QID) | ORAL | 0 refills | Status: AC | PRN

## 2018-04-27 MED ORDER — TRAMADOL HCL 50 MG PO TABS: 50.0000 mg | ORAL_TABLET | Freq: Four times a day (QID) | ORAL | 0 refills | Status: AC | PRN

## 2018-04-27 NOTE — ED Notes (Signed)
Sent note to pharmacy requesting they verify Phenergan.

## 2018-05-06 NOTE — ED Provider Notes (Signed)
[facility]   CSN: [id number] Arrival date & time: 04/26/18  2016     History   Chief Complaint Chief Complaint  Patient presents with  . Abdominal Pain  . Chest Pain    HPI Casey Adams is a 43 y.o. female.  HPI   43 year old female with lower sternal/epigastric pain.  Nausea, vomiting diarrhea.  Onset yesterday.  Persistent since then.  Pain is been pretty constant.  No appreciable exacerbating relieving factors.  No blood in stool or emesis.  Occasional nonproductive cough.  No shortness of breath.  No urinary complaints.  No sick contacts.  She has not tried taking anything for her symptoms.  Past Medical History:  Diagnosis Date  . Abnormal Pap smear    2010  . Bilateral ovarian cysts   . Bipolar 1 disorder (HCC)   . History of stomach ulcers   . Hypertension     Patient Active Problem List   Diagnosis Date Noted  . Pelvic pain in female 09/05/2014  . Acute appendicitis 01/09/2014    Past Surgical History:  Procedure Laterality Date  . CESAREAN SECTION    . CHOLECYSTECTOMY    . LAPAROSCOPIC APPENDECTOMY N/A 01/09/2014   Procedure: APPENDECTOMY LAPAROSCOPIC;  Surgeon: Claud KelpHaywood Ingram, MD;  Location: [facility] OR;  Service: General;  Laterality: N/A;  . TUBAL LIGATION       OB History    Gravida  4   Para  4   Term  3   Preterm  1   AB      Living  4     SAB      TAB      Ectopic      Multiple      Live Births  4            Home Medications    Prior to Admission medications   Medication Sig Start Date End Date Taking? Authorizing Provider  ondansetron (ZOFRAN ODT) 4 MG disintegrating tablet Take 1 tablet (4 mg total) by mouth every 6 (six) hours as needed. Patient not taking: Reported on 04/26/2018 04/11/18   Ward, Layla MawKristen N, DO  ondansetron (ZOFRAN) 4 MG tablet Take 1 tablet (4 mg total) by mouth every 6 (six) hours as needed for nausea or vomiting. 04/27/18   Raeford RazorKohut, Fallon Howerter, MD    traMADol (ULTRAM) 50 MG tablet Take 1 tablet (50 mg total) by mouth every 6 (six) hours as needed. 04/27/18   Raeford RazorKohut, Dub Maclellan, MD    Family History Family History  Problem Relation Age of Onset  . Heart disease Mother   . Hypertension Mother   . Miscarriages / [place] Mother   . Heart disease Father   . Hypertension Father   . Heart disease Maternal Grandmother   . Cancer Paternal Grandmother   . Cancer Paternal Grandfather     Social History Social History   Tobacco Use  . Smoking status: Current Every Day Smoker    Packs/day: 1.00  . Smokeless tobacco: Never Used  Substance Use Topics  . Alcohol use: No  . Drug use: No     Allergies   Patient has no known allergies.   Review of Systems Review of Systems  All systems reviewed and negative, other than as noted in HPI.  Physical Exam Updated Vital Signs BP 120/78   Pulse 71   Temp 98.7 F (37.1 C) (Oral)   Resp (!) 21   Ht 5\' 3"  (1.6 m)  Wt 77.1 kg   SpO2 97%   BMI 30.11 kg/m   Physical Exam Vitals signs and nursing note reviewed.  Constitutional:      General: She is not in acute distress.    Appearance: She is well-developed.  HENT:     Head: Normocephalic and atraumatic.  Eyes:     General:        Right eye: No discharge.        Left eye: No discharge.     Conjunctiva/sclera: Conjunctivae normal.  Neck:     Musculoskeletal: Neck supple.  Cardiovascular:     Rate and Rhythm: Normal rate and regular rhythm.     Heart sounds: Normal heart sounds. No murmur. No friction rub. No gallop.   Pulmonary:     Effort: Pulmonary effort is normal. No respiratory distress.     Breath sounds: Normal breath sounds.  Abdominal:     General: There is no distension.     Palpations: Abdomen is soft.     Tenderness: There is abdominal tenderness in the epigastric area.     Comments: Mild epigastric tenderness without rebound or guarding.  No distention.  Musculoskeletal:        General: No tenderness.   Skin:    General: Skin is warm and dry.  Neurological:     Mental Status: She is alert.  Psychiatric:        Behavior: Behavior normal.        Thought Content: Thought content normal.      ED Treatments / Results  Labs (all labs ordered are listed, but only abnormal results are displayed) Labs Reviewed  COMPREHENSIVE METABOLIC PANEL - Abnormal; Notable for the following components:      Result Value   Potassium 3.3 (*)    CO2 20 (*)    Calcium 8.6 (*)    All other components within normal limits  URINALYSIS, ROUTINE W REFLEX MICROSCOPIC - Abnormal; Notable for the following components:   Color, Urine AMBER (*)    APPearance HAZY (*)    Hgb urine dipstick MODERATE (*)    Protein, ur 100 (*)    Bacteria, UA FEW (*)    All other components within normal limits  CBC WITH DIFFERENTIAL/PLATELET  LIPASE, BLOOD  PREGNANCY, URINE    EKG EKG Interpretation  Date/Time:  Monday April 26 2018 20:28:52 EST Ventricular Rate:  92 PR Interval:    QRS Duration: 80 QT Interval:  362 QTC Calculation: 448 R Axis:   28 Text Interpretation:  Sinus rhythm Borderline T wave abnormalities Confirmed by Raeford Razor [phone]) on 04/26/2018 9:21:57 PM   Radiology No results found.  Procedures Procedures (including critical care time)  Medications Ordered in ED Medications  sodium chloride 0.9 % bolus 1,000 mL (0 mLs Intravenous Stopped 04/27/18 0112)  ondansetron (ZOFRAN) injection 4 mg (4 mg Intravenous Given 04/26/18 2204)  morphine 4 MG/ML injection 6 mg (6 mg Intravenous Given 04/26/18 2204)  loperamide (IMODIUM) capsule 4 mg (4 mg Oral Given 04/26/18 2330)  potassium chloride SA (K-DUR,KLOR-CON) CR tablet 60 mEq (60 mEq Oral Given 04/26/18 2330)  promethazine (PHENERGAN) injection 12.5 mg (12.5 mg Intravenous Given 04/27/18 0104)  morphine 4 MG/ML injection 4 mg (4 mg Intravenous Given 04/27/18 0108)     Initial Impression / Assessment and Plan / ED Course  I have reviewed the  triage vital signs and the nursing notes.  Pertinent labs & imaging results that were available during my care of the patient  were reviewed by me and considered in my medical decision making (see chart for details).     43 year old female with epigastric/lower sternal pain.  Tender in the epigastrium on exam.  Associated with nausea, vomiting and diarrhea.  Atypical symptoms for ACS.  Suspect viral GI illness.  She is treated symptomatically with improvement of symptoms.  I doubt emergent process.  Plan continue symptomatic treatment.  Return precautions were discussed.  It has been determined that no acute conditions requiring further emergency intervention are present at this time. The patient has been advised of the diagnosis and plan. I reviewed any labs and imaging including any potential incidental findings. I have reviewed nursing notes and appropriate previous records. We have discussed signs and symptoms that warrant return to the ED and they are listed in the discharge instructions.      Final Clinical Impressions(s) / ED Diagnoses   Final diagnoses:  Pain of upper abdomen  Nausea vomiting and diarrhea    ED Discharge Orders         Ordered    traMADol (ULTRAM) 50 MG tablet  Every 6 hours PRN     04/27/18 0051    ondansetron (ZOFRAN) 4 MG tablet  Every 6 hours PRN     04/27/18 0051           Raeford RazorKohut, Kadarius Cuffe, MD 05/06/18 1620

## 2019-11-10 IMAGING — CR DG CHEST 2V
2 series · 2 of 2 positions shown · non-contrast
Comparison: 08/16/2014

CLINICAL DATA: New onset after chest pain for 3-4 days, feels like
someone hit me in the chest, cough for 3-4 weeks, history
hypertension

EXAM:
CHEST  2 VIEW

[chest lat]
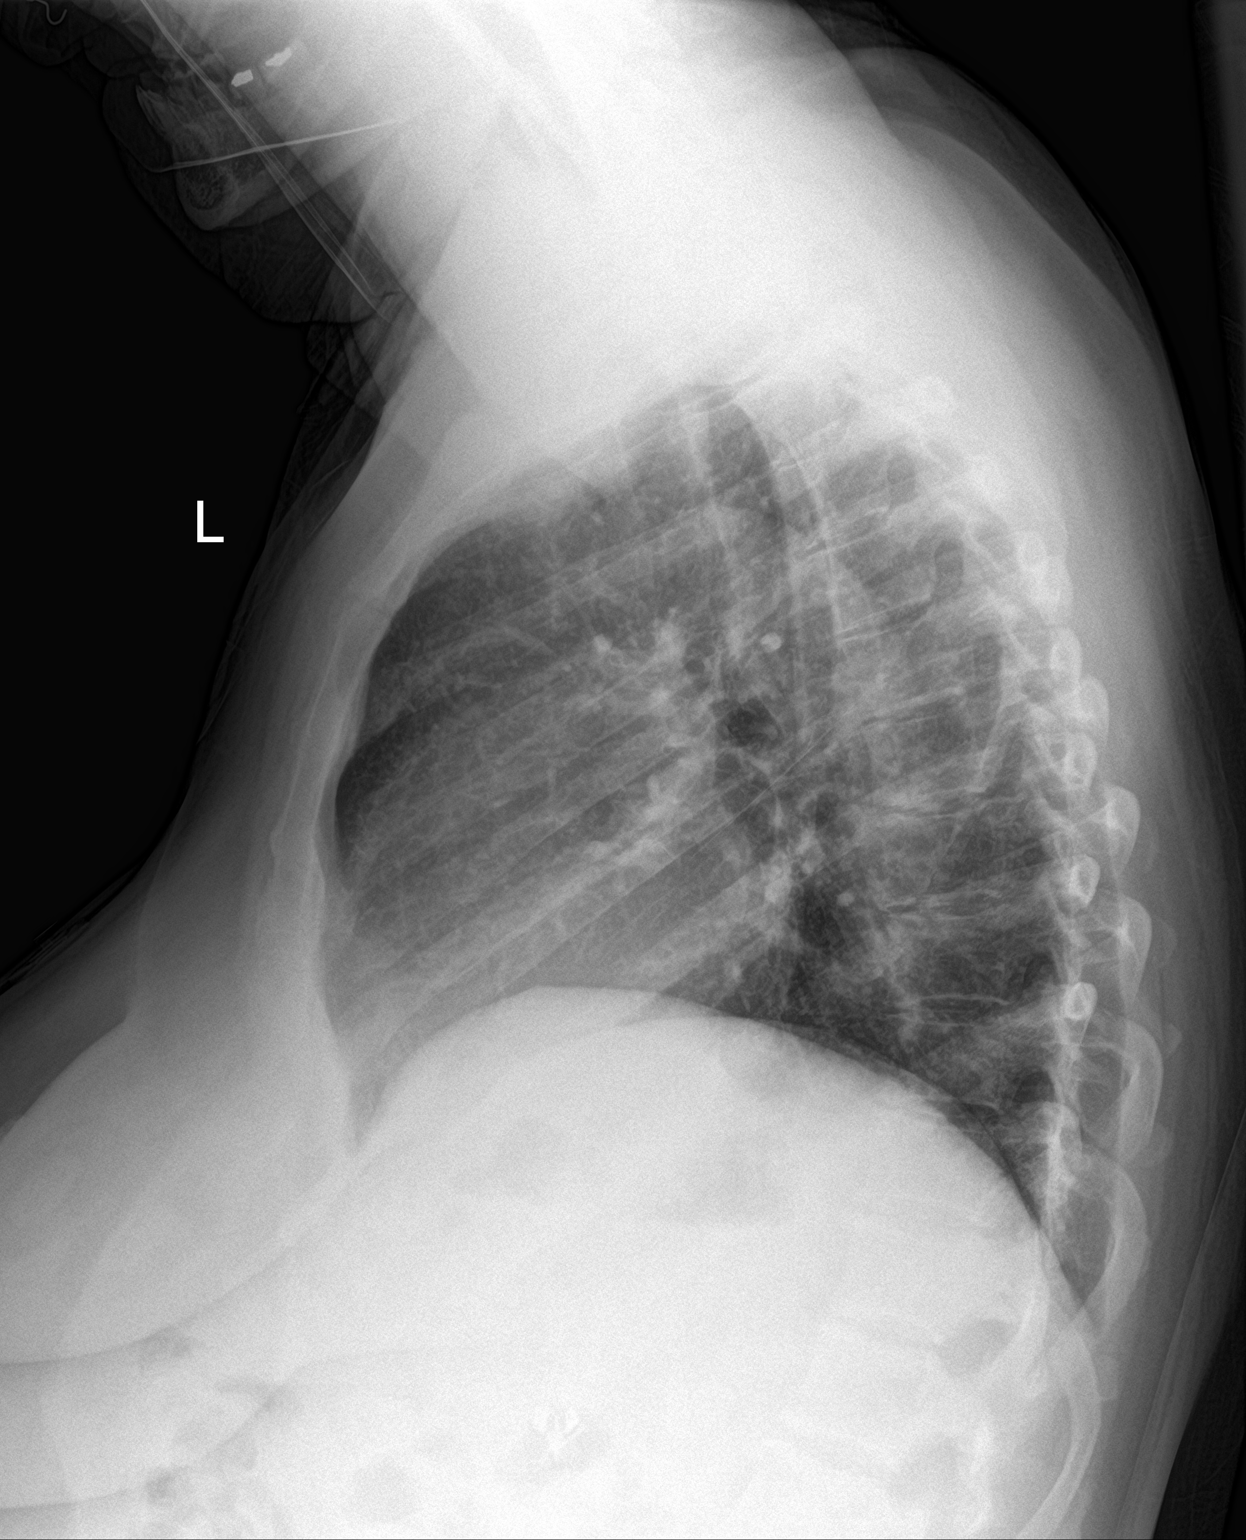

[chest ap]
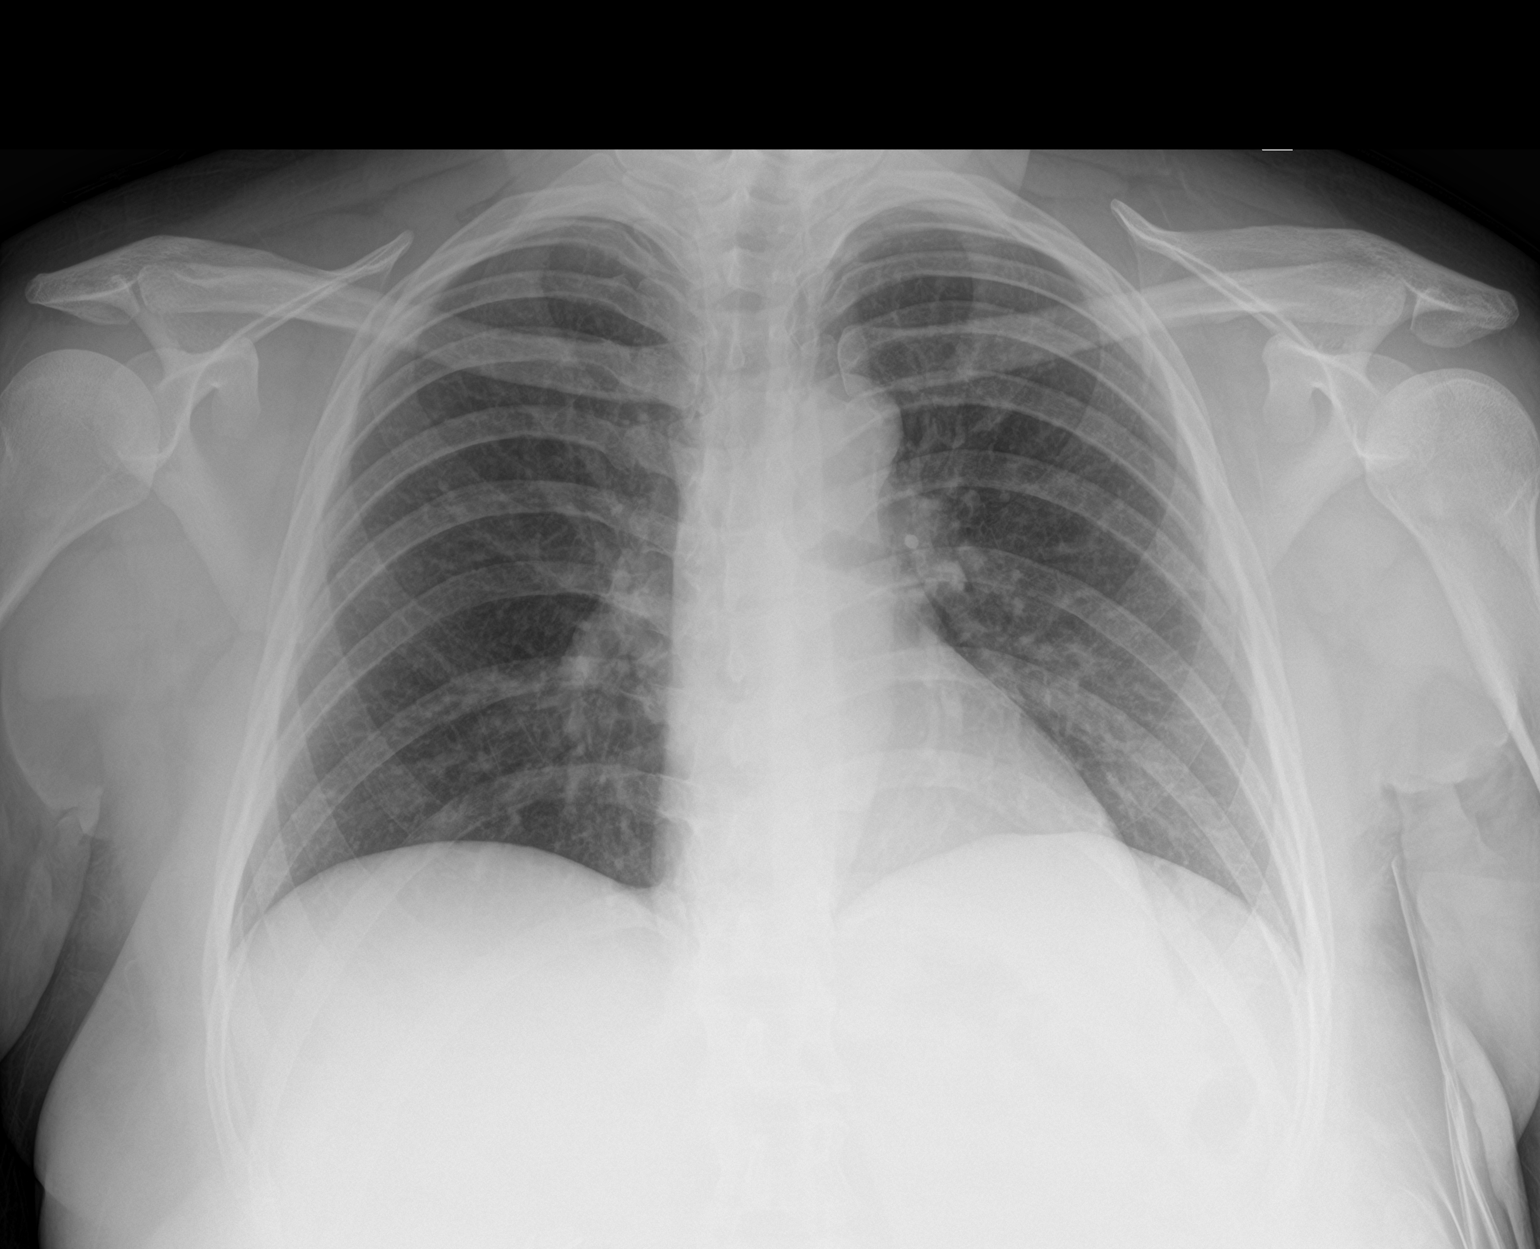

[2 of 2 positions shown; findings below may reference images not displayed]

FINDINGS: Normal heart size, mediastinal contours, and pulmonary vascularity.

Lungs clear.

No pleural effusion or pneumothorax.

Bones unremarkable.
IMPRESSION: Normal exam.

## 2022-02-28 DIAGNOSIS — Z419 Encounter for procedure for purposes other than remedying health state, unspecified: Secondary | ICD-10-CM | POA: Diagnosis not present

## 2022-03-31 DIAGNOSIS — Z419 Encounter for procedure for purposes other than remedying health state, unspecified: Secondary | ICD-10-CM | POA: Diagnosis not present

## 2022-05-25 ENCOUNTER — Emergency Department (HOSPITAL_COMMUNITY): Payer: Medicaid Other

## 2022-05-25 ENCOUNTER — Other Ambulatory Visit: Payer: Self-pay

## 2022-05-25 ENCOUNTER — Encounter (HOSPITAL_COMMUNITY): Payer: Self-pay

## 2022-05-25 ENCOUNTER — Emergency Department (HOSPITAL_COMMUNITY)
Admission: EM | Admit: 2022-05-25 | Discharge: 2022-05-25 | Disposition: A | Discharge status: Home / Self Care | Payer: Medicaid Other | Attending: Emergency Medicine | Admitting: Emergency Medicine

## 2022-05-25 DIAGNOSIS — U071 COVID-19: Secondary | ICD-10-CM | POA: Insufficient documentation

## 2022-05-25 DIAGNOSIS — R509 Fever, unspecified: Secondary | ICD-10-CM | POA: Diagnosis present

## 2022-05-25 LAB — RESP PANEL BY RT-PCR (RSV, FLU A&B, COVID)  RVPGX2
Influenza A by PCR: NEGATIVE
Influenza B by PCR: NEGATIVE
Resp Syncytial Virus by PCR: NEGATIVE
SARS Coronavirus 2 by RT PCR: POSITIVE — AB

## 2022-05-25 MED ORDER — IBUPROFEN 800 MG PO TABS
800.0000 mg | ORAL_TABLET | Freq: Once | ORAL | Status: AC
  Administered 2022-05-25: 800 mg via ORAL
  Filled 2022-05-25: qty 1

## 2022-05-25 MED ORDER — ACETAMINOPHEN 325 MG PO TABS
650.0000 mg | ORAL_TABLET | Freq: Four times a day (QID) | ORAL | Status: DC | PRN
  Administered 2022-05-25: 650 mg via ORAL
  Filled 2022-05-25: qty 2

## 2022-05-25 NOTE — ED Notes (Signed)
Patient transported to X-ray 

## 2022-05-25 NOTE — ED Provider Notes (Signed)
  [place] Provider Note   CSN: [id number] Arrival date & time: 05/25/22  1657     History  Chief Complaint  Patient presents with   Chest Pain    Casey Adams is a 47 y.o. female.  47 year old female presents with left and right sided chest discomfort.  Also notes congestion.  She has had bodyaches.  She is febrile here to 100.5.  Notes questionable sick exposures.  Has myalgias as well as a headache.  Denies any photophobia or neck pain.  Denies any exertional dyspnea.  No nausea or vomiting.  No leg pain or swelling.  No recent travel history.  No treatment use prior to arrival       Home Medications Prior to Admission medications   Medication Sig Start Date End Date Taking? Authorizing Provider  ondansetron (ZOFRAN ODT) 4 MG disintegrating tablet Take 1 tablet (4 mg total) by mouth every 6 (six) hours as needed. Patient not taking: Reported on 04/26/2018 04/11/18   Ward, Delice Bison, DO  ondansetron (ZOFRAN) 4 MG tablet Take 1 tablet (4 mg total) by mouth every 6 (six) hours as needed for nausea or vomiting. 04/27/18   Virgel Manifold, MD  traMADol (ULTRAM) 50 MG tablet Take 1 tablet (50 mg total) by mouth every 6 (six) hours as needed. 04/27/18   Virgel Manifold, MD      Allergies    Patient has no known allergies.    Review of Systems   Review of Systems  All other systems reviewed and are negative.   Physical Exam Updated Vital Signs BP (!) 184/85   Pulse 92   Temp (!) 100.5 F (38.1 C) (Oral)   Resp 17   Ht 1.6 m (5' 3"$ )   Wt 77.1 kg   SpO2 96%   BMI 30.11 kg/m  Physical Exam  ED Results / Procedures / Treatments   Labs (all labs ordered are listed, but only abnormal results are displayed) Labs Reviewed  RESP PANEL BY RT-PCR (RSV, FLU A&B, COVID)  [id number]  BASIC METABOLIC PANEL  CBC  I-STAT BETA HCG BLOOD, ED ([facility], [facility], [facility] ONLY)    EKG EKG Interpretation  Date/Time:  Sunday May 25 2022 17:15:17  EST Ventricular Rate:  92 PR Interval:  154 QRS Duration: 90 QT Interval:  348 QTC Calculation: 431 R Axis:   10 Text Interpretation: Sinus rhythm Anterior infarct, old Confirmed by Lacretia Leigh ([id number]) on 05/25/2022 5:29:08 PM  Radiology No results found.  Procedures Procedures    Medications Ordered in ED Medications - No data to display  ED Course/ Medical Decision Making/ A&P                             Medical Decision Making Amount and/or Complexity of Data Reviewed Labs: ordered. Radiology: ordered.  Risk OTC drugs. Prescription drug management.   Patient's chest x-ray per my interpretation shows no acute findings.  Patient's coronavirus test is positive here.  Suspect she has a viral illness.  Given Tylenol Motrin feels better.  Low suspicion for PE or ACS.  Will discharge home        Final Clinical Impression(s) / ED Diagnoses Final diagnoses:  None    Rx / DC Orders ED Discharge Orders     None         Lacretia Leigh, MD 05/25/22 1954

## 2022-05-25 NOTE — ED Triage Notes (Signed)
Pt c/o right and left sided chest pain that radiates to mid upper back. Pt states the pain is worse on the left side started yesterday. Pt c/o HA, SOB. Pt denies N/V.

## 2022-09-09 DIAGNOSIS — I10 Essential (primary) hypertension: Secondary | ICD-10-CM | POA: Diagnosis not present

## 2022-09-09 DIAGNOSIS — D539 Nutritional anemia, unspecified: Secondary | ICD-10-CM | POA: Diagnosis not present

## 2022-09-09 DIAGNOSIS — Z0000 Encounter for general adult medical examination without abnormal findings: Secondary | ICD-10-CM | POA: Diagnosis not present

## 2022-09-09 DIAGNOSIS — Z3200 Encounter for pregnancy test, result unknown: Secondary | ICD-10-CM | POA: Diagnosis not present

## 2022-09-09 DIAGNOSIS — Z7251 High risk heterosexual behavior: Secondary | ICD-10-CM | POA: Diagnosis not present

## 2022-09-09 DIAGNOSIS — E559 Vitamin D deficiency, unspecified: Secondary | ICD-10-CM | POA: Diagnosis not present

## 2022-09-09 DIAGNOSIS — E612 Magnesium deficiency: Secondary | ICD-10-CM | POA: Diagnosis not present

## 2022-09-09 DIAGNOSIS — R5383 Other fatigue: Secondary | ICD-10-CM | POA: Diagnosis not present

## 2022-09-11 DIAGNOSIS — Z124 Encounter for screening for malignant neoplasm of cervix: Secondary | ICD-10-CM | POA: Diagnosis not present

## 2022-09-23 DIAGNOSIS — Z6832 Body mass index (BMI) 32.0-32.9, adult: Secondary | ICD-10-CM | POA: Diagnosis not present

## 2022-09-23 DIAGNOSIS — E7800 Pure hypercholesterolemia, unspecified: Secondary | ICD-10-CM | POA: Diagnosis not present

## 2022-09-23 DIAGNOSIS — E559 Vitamin D deficiency, unspecified: Secondary | ICD-10-CM | POA: Diagnosis not present

## 2022-09-23 DIAGNOSIS — E119 Type 2 diabetes mellitus without complications: Secondary | ICD-10-CM | POA: Diagnosis not present

## 2022-09-23 DIAGNOSIS — I10 Essential (primary) hypertension: Secondary | ICD-10-CM | POA: Diagnosis not present

## 2022-09-23 DIAGNOSIS — Z3200 Encounter for pregnancy test, result unknown: Secondary | ICD-10-CM | POA: Diagnosis not present

## 2022-09-23 DIAGNOSIS — Z1331 Encounter for screening for depression: Secondary | ICD-10-CM | POA: Diagnosis not present

## 2022-12-03 ENCOUNTER — Emergency Department (HOSPITAL_COMMUNITY): Payer: Medicaid Other

## 2022-12-03 ENCOUNTER — Other Ambulatory Visit: Payer: Self-pay

## 2022-12-03 ENCOUNTER — Emergency Department (HOSPITAL_COMMUNITY)
Admission: EM | Admit: 2022-12-03 | Discharge: 2022-12-03 | Disposition: A | Discharge status: Home / Self Care | Payer: Medicaid Other | Attending: Emergency Medicine | Admitting: Emergency Medicine

## 2022-12-03 DIAGNOSIS — D72829 Elevated white blood cell count, unspecified: Secondary | ICD-10-CM | POA: Diagnosis not present

## 2022-12-03 DIAGNOSIS — I10 Essential (primary) hypertension: Secondary | ICD-10-CM | POA: Diagnosis not present

## 2022-12-03 DIAGNOSIS — R079 Chest pain, unspecified: Secondary | ICD-10-CM | POA: Insufficient documentation

## 2022-12-03 DIAGNOSIS — J9811 Atelectasis: Secondary | ICD-10-CM | POA: Insufficient documentation

## 2022-12-03 DIAGNOSIS — R0789 Other chest pain: Secondary | ICD-10-CM | POA: Diagnosis not present

## 2022-12-03 DIAGNOSIS — J189 Pneumonia, unspecified organism: Secondary | ICD-10-CM | POA: Diagnosis not present

## 2022-12-03 LAB — COMPREHENSIVE METABOLIC PANEL
ALT: 16 U/L (ref 0–44)
AST: 22 U/L (ref 15–41)
Albumin: 3.8 g/dL (ref 3.5–5.0)
Alkaline Phosphatase: 68 U/L (ref 38–126)
Anion gap: 9 (ref 5–15)
BUN: 7 mg/dL (ref 6–20)
CO2: 20 mmol/L — ABNORMAL LOW (ref 22–32)
Calcium: 9 mg/dL (ref 8.9–10.3)
Chloride: 107 mmol/L (ref 98–111)
Creatinine, Ser: 0.68 mg/dL (ref 0.44–1.00)
GFR, Estimated: >60 mL/min (ref >60)
Glucose, Bld: 122 mg/dL — ABNORMAL HIGH (ref 70–99)
Potassium: 4.2 mmol/L (ref 3.5–5.1)
Sodium: 136 mmol/L (ref 135–145)
Total Bilirubin: 0.7 mg/dL (ref 0.3–1.2)
Total Protein: 7.1 g/dL (ref 6.5–8.1)

## 2022-12-03 LAB — CBC
HCT: 45.7 % (ref 36.0–46.0)
Hemoglobin: 15.4 g/dL — ABNORMAL HIGH (ref 12.0–15.0)
MCH: 30.7 pg (ref 26.0–34.0)
MCHC: 33.7 g/dL (ref 30.0–36.0)
MCV: 91.2 fL (ref 80.0–100.0)
Platelets: DECREASED 10*3/uL (ref 150–400)
RBC: 5.01 MIL/uL (ref 3.87–5.11)
RDW: 14.3 % (ref 11.5–15.5)
WBC: 11.2 10*3/uL — ABNORMAL HIGH (ref 4.0–10.5)
nRBC: 0 % (ref 0.0–0.2)

## 2022-12-03 LAB — LIPASE, BLOOD: Lipase: 30 U/L (ref 11–51)

## 2022-12-03 LAB — TROPONIN I (HIGH SENSITIVITY)
Troponin I (High Sensitivity): 7 ng/L (ref <18)
Troponin I (High Sensitivity): 7 ng/L (ref <18)

## 2022-12-03 LAB — D-DIMER, QUANTITATIVE: D-Dimer, Quant: 0.39 ug{FEU}/mL (ref 0.00–0.50)

## 2022-12-03 LAB — HCG, SERUM, QUALITATIVE: Preg, Serum: NEGATIVE

## 2022-12-03 MED ORDER — CYCLOBENZAPRINE HCL 5 MG PO TABS: 5.0000 mg | ORAL_TABLET | Freq: Three times a day (TID) | ORAL | 0 refills | Status: AC | PRN

## 2022-12-03 MED ORDER — KETOROLAC TROMETHAMINE 15 MG/ML IJ SOLN
15.0000 mg | Freq: Once | INTRAMUSCULAR | Status: AC
  Administered 2022-12-03: 15 mg via INTRAVENOUS
  Filled 2022-12-03: qty 1

## 2022-12-03 MED ORDER — LIDOCAINE 5 % EX PTCH
2.0000 | MEDICATED_PATCH | CUTANEOUS | Status: DC
  Administered 2022-12-03: 2 via TRANSDERMAL
  Filled 2022-12-03 (×2): qty 2

## 2022-12-03 MED ORDER — AMOXICILLIN-POT CLAVULANATE 875-125 MG PO TABS: 1.0000 | ORAL_TABLET | Freq: Two times a day (BID) | ORAL | 0 refills | Status: AC

## 2022-12-03 MED ORDER — IBUPROFEN 800 MG PO TABS: 800.0000 mg | ORAL_TABLET | Freq: Three times a day (TID) | ORAL | 0 refills | Status: AC | PRN

## 2022-12-03 MED ORDER — HYDROCODONE-ACETAMINOPHEN 5-325 MG PO TABS
1.0000 | ORAL_TABLET | Freq: Once | ORAL | Status: AC
  Administered 2022-12-03: 1 via ORAL
  Filled 2022-12-03: qty 1

## 2022-12-03 MED ORDER — DOXYCYCLINE HYCLATE 100 MG PO CAPS: 100.0000 mg | ORAL_CAPSULE | Freq: Two times a day (BID) | ORAL | 0 refills | Status: AC

## 2022-12-03 NOTE — ED Triage Notes (Signed)
Pt. Stated, I started having pain under my arm and going around front and to my back. This started yesterday morning and eased off and started back again.

## 2022-12-03 NOTE — ED Provider Notes (Signed)
[place] EMERGENCY DEPARTMENT AT [facility] Provider Note   CSN: [id number] Arrival date & time: 12/03/22  0747     History  Chief Complaint  Patient presents with   Chest Pain   Abdominal Pain    Casey Adams is a 47 y.o. female, history of hypertension, cholecystectomy, who presents to the ED secondary to the left chest pain, radiating to the back, it has been going on for the last 24 hours.  She states it is kind of persistent, and has not been relieved by ibuprofen.  She states that sharp and shooting, and radiates to the back.  Denies any nausea, vomiting.  Notes that is worse when she moves specifically, when she lays on her back.  Denies any rash, fever or chills.  Pain is worse when taking a deep breath.  Has had a increase in her cough, since being on lisinopril, but she states is improved, after being taken off of it.  Denies any swelling of the legs, the arms.  Not on oral contraceptives.  No recent injury.  Is a smoker.     Home Medications Prior to Admission medications   Medication Sig Start Date End Date Taking? Authorizing Provider  amoxicillin-clavulanate (AUGMENTIN) 875-125 MG tablet Take 1 tablet by mouth every 12 (twelve) hours. 12/03/22  Yes Ikenna Ohms L, PA  cyclobenzaprine (FLEXERIL) 5 MG tablet Take 1 tablet (5 mg total) by mouth 3 (three) times daily as needed for muscle spasms. 12/03/22  Yes Ethel Veronica L, PA  doxycycline (VIBRAMYCIN) 100 MG capsule Take 1 capsule (100 mg total) by mouth 2 (two) times daily. 12/03/22  Yes Karryn Kosinski L, PA  ibuprofen (ADVIL) 800 MG tablet Take 1 tablet (800 mg total) by mouth every 8 (eight) hours as needed. 12/03/22  Yes Johnika Escareno L, PA  ondansetron (ZOFRAN ODT) 4 MG disintegrating tablet Take 1 tablet (4 mg total) by mouth every 6 (six) hours as needed. Patient not taking: Reported on 04/26/2018 04/11/18   Ward, Layla Maw, DO  ondansetron (ZOFRAN) 4 MG tablet Take 1 tablet (4 mg total) by mouth every 6 (six)  hours as needed for nausea or vomiting. 04/27/18   Raeford Razor, MD  traMADol (ULTRAM) 50 MG tablet Take 1 tablet (50 mg total) by mouth every 6 (six) hours as needed. 04/27/18   Raeford Razor, MD      Allergies    Patient has no known allergies.    Review of Systems   Review of Systems  Respiratory:  Negative for shortness of breath.   Cardiovascular:  Positive for chest pain.  Gastrointestinal:  Positive for abdominal pain.    Physical Exam Updated Vital Signs BP (!) 146/83   Pulse 64   Temp 98 F (36.7 C) (Oral)   Resp 20   Ht 5\' 3"  (1.6 m)   Wt 81.6 kg   LMP  (LMP Unknown)   SpO2 95%   BMI 31.89 kg/m  Physical Exam Vitals and nursing note reviewed.  Constitutional:      General: She is not in acute distress.    Appearance: She is well-developed.  HENT:     Head: Normocephalic and atraumatic.  Eyes:     Conjunctiva/sclera: Conjunctivae normal.  Cardiovascular:     Rate and Rhythm: Normal rate and regular rhythm.     Heart sounds: No murmur heard. Pulmonary:     Effort: Pulmonary effort is normal. No respiratory distress.     Breath sounds: Examination of the  left-lower field reveals rhonchi. Rhonchi present.  Chest:     Comments: Tenderness to palpation of left lateral chest wall as well as left posterior chest wall.  No rash noted or crepitus or edema. Abdominal:     Palpations: Abdomen is soft.     Tenderness: There is no abdominal tenderness.  Musculoskeletal:        General: No swelling.     Cervical back: Neck supple.  Skin:    General: Skin is warm and dry.     Capillary Refill: Capillary refill takes less than 2 seconds.  Neurological:     Mental Status: She is alert.  Psychiatric:        Mood and Affect: Mood normal.     ED Results / Procedures / Treatments   Labs (all labs ordered are listed, but only abnormal results are displayed) Labs Reviewed  CBC - Abnormal; Notable for the following components:      Result Value   WBC 11.2 (*)     Hemoglobin 15.4 (*)    All other components within normal limits  COMPREHENSIVE METABOLIC PANEL - Abnormal; Notable for the following components:   CO2 20 (*)    Glucose, Bld 122 (*)    All other components within normal limits  HCG, SERUM, QUALITATIVE  LIPASE, BLOOD  D-DIMER, QUANTITATIVE (NOT AT [facility])  TROPONIN I (HIGH SENSITIVITY)  TROPONIN I (HIGH SENSITIVITY)    EKG None  Radiology DG Chest 2 View  Result Date: 12/03/2022 CLINICAL DATA:  Chest pain. EXAM: CHEST - 2 VIEW COMPARISON:  May 25, 2022. FINDINGS: The heart size and mediastinal contours are within normal limits. Linear densities are noted in left midlung concerning for subsegmental atelectasis or scarring. Right lung is clear. The visualized skeletal structures are unremarkable. IMPRESSION: Minimal left midlung subsegmental atelectasis or scarring. Electronically Signed   By: Lupita Raider M.D.   On: 12/03/2022 08:38    Procedures Procedures    Medications Ordered in ED Medications  lidocaine (LIDODERM) 5 % 2 patch (2 patches Transdermal Patch Applied 12/03/22 0955)  ketorolac (TORADOL) 15 MG/ML injection 15 mg (15 mg Intravenous Given 12/03/22 0953)  HYDROcodone-acetaminophen (NORCO/VICODIN) 5-325 MG per tablet 1 tablet (1 tablet Oral Given 12/03/22 1036)    ED Course/ Medical Decision Making/ A&P                                 Medical Decision Making Patient is a 47 year old female, history of tobacco use, who presents to the ED secondary to left-sided chest pain radiating to the back, has been going on for the last day.  States it so painful she cannot hardly sleep.  It is pleuritic in nature.  We will obtain a D-dimer, even though she is low risk, to rule out a PE given her pleuritic chest pain.  Additionally we will obtain troponins, chest x-ray for further evaluation.  She does have some crackles in her left lower lobe, possible pneumonia, pending x-ray.  Will give her Toradol, lidocaine patch for pain  control.  Amount and/or Complexity of Data Reviewed Labs: ordered.    Details: Mild leukocytosis of 11.2 negative troponin, D-dimer less than 0.5 Radiology: ordered.    Details: Chest x-ray shows left lower lobe atelectasis ECG/medicine tests:  Decision-making details documented in ED Course. Discussion of management or test interpretation with external provider(s): Discussed with patient she has left lower lobe crackles, pain is improved  with the Percocet, her chest x-ray shows left lower lobe atelectasis however given the cough, shortness of breath, leukocytosis, and persistent pain, we will treat for pneumonia with Augmentin, as well as doxycycline.  Will also send her the Flexeril.  If not pneumonia, then likely muscle strain.  Given however clinically patient is homeless, has poor follow-up, we will treat.  Discussed with patient she voiced understanding, and was discharged home with strict return precautions.  She is not hypoxic, and D-dimer is less than 0.5, which is reassuring, troponin negative.  Risk Prescription drug management.   Final Clinical Impression(s) / ED Diagnoses Final diagnoses:  Chest pain, unspecified type  Atelectasis    Rx / DC Orders ED Discharge Orders          Ordered    amoxicillin-clavulanate (AUGMENTIN) 875-125 MG tablet  Every 12 hours        12/03/22 1230    doxycycline (VIBRAMYCIN) 100 MG capsule  2 times daily        12/03/22 1230    ibuprofen (ADVIL) 800 MG tablet  Every 8 hours PRN        12/03/22 1230    cyclobenzaprine (FLEXERIL) 5 MG tablet  3 times daily PRN        12/03/22 1230              Chiamaka Latka, [place], PA 12/03/22 1428    Ernie Avena, MD 12/03/22 1724

## 2022-12-03 NOTE — Discharge Instructions (Addendum)
I am concerned you may be developing pneumonia, we will treat you for pneumonia, as well as prescribe you some medicine for your chest wall pain.  If it is not pneumonia may be a muscle spasm/strain.  Make sure you are taking it easy, and not lifting any heavy objects.  Take the antibiotics as prescribed.

## 2022-12-30 DIAGNOSIS — F321 Major depressive disorder, single episode, moderate: Secondary | ICD-10-CM | POA: Diagnosis not present

## 2023-01-01 DIAGNOSIS — F321 Major depressive disorder, single episode, moderate: Secondary | ICD-10-CM | POA: Diagnosis not present

## 2023-01-06 DIAGNOSIS — F321 Major depressive disorder, single episode, moderate: Secondary | ICD-10-CM | POA: Diagnosis not present

## 2023-01-13 DIAGNOSIS — F321 Major depressive disorder, single episode, moderate: Secondary | ICD-10-CM | POA: Diagnosis not present

## 2023-02-03 ENCOUNTER — Other Ambulatory Visit: Payer: Self-pay

## 2023-02-03 ENCOUNTER — Encounter (HOSPITAL_COMMUNITY): Payer: Self-pay | Admitting: *Deleted

## 2023-02-03 ENCOUNTER — Emergency Department (HOSPITAL_COMMUNITY)
Admission: EM | Admit: 2023-02-03 | Discharge: 2023-02-03 | Discharge status: Against Medical Advice | Payer: Medicaid Other | Attending: Emergency Medicine | Admitting: Emergency Medicine

## 2023-02-03 DIAGNOSIS — Z5321 Procedure and treatment not carried out due to patient leaving prior to being seen by health care provider: Secondary | ICD-10-CM | POA: Diagnosis not present

## 2023-02-03 DIAGNOSIS — M549 Dorsalgia, unspecified: Secondary | ICD-10-CM | POA: Diagnosis present

## 2023-02-03 NOTE — ED Notes (Signed)
LWBS 

## 2023-02-03 NOTE — ED Triage Notes (Addendum)
Pt is here with kidney area pain (back pain) and right side is worse than left. No states no dysuria or frequency. Pain hurts with movement. States happening for last few nights. Pt has had this pain for quite a long time. No numbness or tingling in legs. No injury or fall

## 2023-03-12 DIAGNOSIS — F321 Major depressive disorder, single episode, moderate: Secondary | ICD-10-CM | POA: Diagnosis not present

## 2023-03-18 DIAGNOSIS — F321 Major depressive disorder, single episode, moderate: Secondary | ICD-10-CM | POA: Diagnosis not present

## 2023-04-07 DIAGNOSIS — F321 Major depressive disorder, single episode, moderate: Secondary | ICD-10-CM | POA: Diagnosis not present

## 2023-04-13 DIAGNOSIS — F321 Major depressive disorder, single episode, moderate: Secondary | ICD-10-CM | POA: Diagnosis not present

## 2023-04-22 DIAGNOSIS — F321 Major depressive disorder, single episode, moderate: Secondary | ICD-10-CM | POA: Diagnosis not present

## 2023-04-25 DIAGNOSIS — F321 Major depressive disorder, single episode, moderate: Secondary | ICD-10-CM | POA: Diagnosis not present

## 2023-05-01 DIAGNOSIS — F321 Major depressive disorder, single episode, moderate: Secondary | ICD-10-CM | POA: Diagnosis not present

## 2023-05-11 DIAGNOSIS — F321 Major depressive disorder, single episode, moderate: Secondary | ICD-10-CM | POA: Diagnosis not present

## 2023-05-28 DIAGNOSIS — F321 Major depressive disorder, single episode, moderate: Secondary | ICD-10-CM | POA: Diagnosis not present

## 2023-06-03 DIAGNOSIS — F321 Major depressive disorder, single episode, moderate: Secondary | ICD-10-CM | POA: Diagnosis not present

## 2023-06-04 DIAGNOSIS — F321 Major depressive disorder, single episode, moderate: Secondary | ICD-10-CM | POA: Diagnosis not present

## 2023-06-09 DIAGNOSIS — F321 Major depressive disorder, single episode, moderate: Secondary | ICD-10-CM | POA: Diagnosis not present

## 2023-06-13 DIAGNOSIS — F321 Major depressive disorder, single episode, moderate: Secondary | ICD-10-CM | POA: Diagnosis not present

## 2023-06-24 DIAGNOSIS — F321 Major depressive disorder, single episode, moderate: Secondary | ICD-10-CM | POA: Diagnosis not present

## 2023-07-09 DIAGNOSIS — F321 Major depressive disorder, single episode, moderate: Secondary | ICD-10-CM | POA: Diagnosis not present

## 2023-07-25 DIAGNOSIS — F321 Major depressive disorder, single episode, moderate: Secondary | ICD-10-CM | POA: Diagnosis not present

## 2023-07-29 DIAGNOSIS — F321 Major depressive disorder, single episode, moderate: Secondary | ICD-10-CM | POA: Diagnosis not present

## 2023-08-03 DIAGNOSIS — F321 Major depressive disorder, single episode, moderate: Secondary | ICD-10-CM | POA: Diagnosis not present

## 2023-08-13 DIAGNOSIS — F321 Major depressive disorder, single episode, moderate: Secondary | ICD-10-CM | POA: Diagnosis not present

## 2023-08-21 DIAGNOSIS — F321 Major depressive disorder, single episode, moderate: Secondary | ICD-10-CM | POA: Diagnosis not present

## 2023-08-29 ENCOUNTER — Encounter (HOSPITAL_COMMUNITY): Payer: Self-pay

## 2023-08-29 ENCOUNTER — Emergency Department (HOSPITAL_COMMUNITY)
Admission: EM | Admit: 2023-08-29 | Discharge: 2023-08-29 | Disposition: A | Discharge status: Home / Self Care | Attending: Emergency Medicine | Admitting: Emergency Medicine

## 2023-08-29 ENCOUNTER — Other Ambulatory Visit: Payer: Self-pay

## 2023-08-29 DIAGNOSIS — L237 Allergic contact dermatitis due to plants, except food: Secondary | ICD-10-CM | POA: Diagnosis not present

## 2023-08-29 DIAGNOSIS — R21 Rash and other nonspecific skin eruption: Secondary | ICD-10-CM | POA: Diagnosis present

## 2023-08-29 LAB — CBG MONITORING, ED: Glucose-Capillary: 202 mg/dL — ABNORMAL HIGH (ref 70–99)

## 2023-08-29 MED ORDER — BLOOD GLUCOSE TEST VI STRP
1.0000 | ORAL_STRIP | Freq: Three times a day (TID) | 0 refills | Status: AC
Start: 2023-08-29 — End: 2023-09-28

## 2023-08-29 MED ORDER — LANCETS MISC. MISC
1.0000 | Freq: Three times a day (TID) | 0 refills | Status: AC
Start: 2023-08-29 — End: 2023-09-28

## 2023-08-29 MED ORDER — HYDROXYZINE HCL 25 MG PO TABS: 25.0000 mg | ORAL_TABLET | Freq: Three times a day (TID) | ORAL | 0 refills | Status: AC | PRN

## 2023-08-29 MED ORDER — LANCET DEVICE MISC: 1.0000 | Freq: Three times a day (TID) | 0 refills | Status: AC

## 2023-08-29 MED ORDER — PREDNISONE 10 MG PO TABS
ORAL_TABLET | ORAL | 0 refills | Status: AC
Start: 2023-08-29 — End: ?

## 2023-08-29 MED ORDER — BLOOD GLUCOSE MONITORING SUPPL DEVI: 1.0000 | Freq: Three times a day (TID) | 0 refills | Status: AC

## 2023-08-29 NOTE — ED Triage Notes (Signed)
 Pt c.o rash all over her body x 1 week. States it started on her face and is now everywhere, worse on her left arm with blisters

## 2023-08-29 NOTE — Discharge Instructions (Addendum)
 Monitor your glucose.  Return to the Emergency department if increased glucose or worsening symptoms

## 2023-08-29 NOTE — ED Notes (Signed)
 Pt states that skin rash and swelling began on the left side of her face "last Saturday." In the days since the rash has spread to multiple locations of her body. Pt originally attributed rash to poison ivy as she working doing yard work "Thursday prior to everything starting." Pt describes the rash as "burning, painful, itching and sore." Pt tried to manage s/s with Calamine lotion with no relief. Pt experience "a little" relief with cocoa butter. Pt admits hx of bedbugs but denies similar rash.

## 2023-08-29 NOTE — ED Provider Notes (Signed)
 [place] EMERGENCY DEPARTMENT AT [facility] Provider Note   CSN: [id number] Arrival date & time: 08/29/23  1018     History  Chief Complaint  Patient presents with   Rash    Casey Adams is a 48 y.o. female.  Patient complains of a full body rash.  Patient reports that she developed a rash on her arms and her hands and face a week ago after working pulling some weeds and cleaning up an area.  Patient reports that she has worked around poison ivy in the past and she has never had it in the past.  She reports area has spread to her abdomen and her legs.  Patient denies any fever or chills she has not had any medical problems.  Patient denies any chest or abdominal pain.  Patient has a past medical history of hypertension and diabetes.  She has not had to take medication for diabetes  The history is provided by the patient. No language interpreter was used.  Rash      Home Medications Prior to Admission medications   Medication Sig Start Date End Date Taking? Authorizing Provider  Blood Glucose Monitoring Suppl DEVI 1 each by Does not apply route in the morning, at noon, and at bedtime. May substitute to any manufacturer covered by patient's insurance. 08/29/23  Yes Kiaja Shorty K, PA-C  Glucose Blood (BLOOD GLUCOSE TEST STRIPS) STRP 1 each by In Vitro route in the morning, at noon, and at bedtime. May substitute to any manufacturer covered by patient's insurance. 08/29/23 09/28/23 Yes Olanda Downie K, PA-C  hydrOXYzine (ATARAX) 25 MG tablet Take 1 tablet (25 mg total) by mouth 3 (three) times daily as needed. 08/29/23  Yes Sandi Crosby, PA-C  Lancet Device MISC 1 each by Does not apply route in the morning, at noon, and at bedtime. May substitute to any manufacturer covered by patient's insurance. 08/29/23 09/28/23 Yes Sandi Crosby, PA-C  Lancets Misc. MISC 1 each by Does not apply route in the morning, at noon, and at bedtime. May substitute to any manufacturer covered  by patient's insurance. 08/29/23 09/28/23 Yes Ladawna Walgren K, PA-C  predniSONE (DELTASONE) 10 MG tablet 6,6,5,5,4,4,3,3,2,2,1,1 taper 08/29/23  Yes Taylie Helder K, PA-C  amoxicillin -clavulanate (AUGMENTIN ) 875-125 MG tablet Take 1 tablet by mouth every 12 (twelve) hours. 12/03/22   Small, Brooke L, PA  cyclobenzaprine  (FLEXERIL ) 5 MG tablet Take 1 tablet (5 mg total) by mouth 3 (three) times daily as needed for muscle spasms. 12/03/22   Small, Brooke L, PA  doxycycline  (VIBRAMYCIN ) 100 MG capsule Take 1 capsule (100 mg total) by mouth 2 (two) times daily. 12/03/22   Small, Brooke L, PA  ibuprofen  (ADVIL ) 800 MG tablet Take 1 tablet (800 mg total) by mouth every 8 (eight) hours as needed. 12/03/22   Small, Brooke L, PA  ondansetron  (ZOFRAN  ODT) 4 MG disintegrating tablet Take 1 tablet (4 mg total) by mouth every 6 (six) hours as needed. Patient not taking: Reported on 04/26/2018 04/11/18   Ward, Clover Dao, DO  ondansetron  (ZOFRAN ) 4 MG tablet Take 1 tablet (4 mg total) by mouth every 6 (six) hours as needed for nausea or vomiting. 04/27/18   Bart Born, MD  traMADol  (ULTRAM ) 50 MG tablet Take 1 tablet (50 mg total) by mouth every 6 (six) hours as needed. 04/27/18   Bart Born, MD      Allergies    Patient has no known allergies.    Review of Systems  Review of Systems  Skin:  Positive for rash.  All other systems reviewed and are negative.   Physical Exam Updated Vital Signs BP (!) 216/92 (BP Location: Right Arm)   Pulse 88   Temp 97.8 F (36.6 C) (Oral)   Resp 18   Ht 5\' 3"  (1.6 m)   Wt 77.1 kg   SpO2 100%   BMI 30.11 kg/m  Physical Exam Vitals and nursing note reviewed.  Constitutional:      Appearance: She is well-developed.  HENT:     Head: Normocephalic.  Cardiovascular:     Rate and Rhythm: Normal rate.  Pulmonary:     Effort: Pulmonary effort is normal.  Abdominal:     General: There is no distension.  Musculoskeletal:        General: Normal range of motion.  Skin:     Comments: Red raised linear rash arms chest abdomen legs, scalp face and back are spared.  Neurological:     General: No focal deficit present.     Mental Status: She is alert and oriented to person, place, and time.     ED Results / Procedures / Treatments   Labs (all labs ordered are listed, but only abnormal results are displayed) Labs Reviewed  CBG MONITORING, ED - Abnormal; Notable for the following components:      Result Value   Glucose-Capillary 202 (*)    All other components within normal limits    EKG None  Radiology No results found.  Procedures Procedures    Medications Ordered in ED Medications - No data to display  ED Course/ Medical Decision Making/ A&P                                 Medical Decision Making Patient complains of a full body rash after working in a yard  Amount and/or Complexity of Data Reviewed Labs: ordered. Decision-making details documented in ED Course.    Details: CBG is 202  Risk OTC drugs. Prescription drug management. Risk Details: Dr. Val Garin evaluated patient and advised prednisone.  Patient is given Atarax as she has not had any relief with over-the-counter medications.  Patient counseled on the need for follow-up with primary care physician for diabetes.  She is given a glucose monitoring device prescription she is advised to monitor her glucose carefully as prednisone may cause her to have increased glucose.            Final Clinical Impression(s) / ED Diagnoses Final diagnoses:  Contact dermatitis due to poison ivy    Rx / DC Orders ED Discharge Orders          Ordered    predniSONE (DELTASONE) 10 MG tablet        08/29/23 1312    hydrOXYzine (ATARAX) 25 MG tablet  3 times daily PRN        08/29/23 1312    Blood Glucose Monitoring Suppl DEVI  3 times daily        08/29/23 1312    Glucose Blood (BLOOD GLUCOSE TEST STRIPS) STRP  3 times daily        08/29/23 1312    Lancet Device MISC  3 times  daily        08/29/23 1312    Lancets Misc. MISC  3 times daily        08/29/23 1312           An  After Visit Summary was printed and given to the patient.    Jibran Crookshanks K, PA-C 08/29/23 1544    Mozell Arias, MD 08/31/23 1419

## 2023-09-09 DIAGNOSIS — F321 Major depressive disorder, single episode, moderate: Secondary | ICD-10-CM | POA: Diagnosis not present

## 2023-09-15 DIAGNOSIS — F321 Major depressive disorder, single episode, moderate: Secondary | ICD-10-CM | POA: Diagnosis not present

## 2023-09-21 DIAGNOSIS — F321 Major depressive disorder, single episode, moderate: Secondary | ICD-10-CM | POA: Diagnosis not present

## 2023-09-23 ENCOUNTER — Emergency Department (HOSPITAL_COMMUNITY)
Admission: EM | Admit: 2023-09-23 | Discharge: 2023-09-24 | Disposition: A | Discharge status: Home / Self Care | Attending: Emergency Medicine | Admitting: Emergency Medicine

## 2023-09-23 ENCOUNTER — Other Ambulatory Visit: Payer: Self-pay

## 2023-09-23 DIAGNOSIS — T679XXA Effect of heat and light, unspecified, initial encounter: Secondary | ICD-10-CM

## 2023-09-23 DIAGNOSIS — T675XXA Heat exhaustion, unspecified, initial encounter: Secondary | ICD-10-CM | POA: Diagnosis not present

## 2023-09-23 DIAGNOSIS — I10 Essential (primary) hypertension: Secondary | ICD-10-CM | POA: Insufficient documentation

## 2023-09-23 DIAGNOSIS — Z5900 Homelessness unspecified: Secondary | ICD-10-CM | POA: Insufficient documentation

## 2023-09-23 DIAGNOSIS — E1165 Type 2 diabetes mellitus with hyperglycemia: Secondary | ICD-10-CM | POA: Diagnosis not present

## 2023-09-23 DIAGNOSIS — R739 Hyperglycemia, unspecified: Secondary | ICD-10-CM

## 2023-09-23 NOTE — ED Triage Notes (Signed)
 Patient reports exposure to heat all day ( homeless) , states feeling tired/fatigue , dry mouth and lightheaded . Respirations unlabored /alert and oriented.

## 2023-09-24 LAB — CBG MONITORING, ED: Glucose-Capillary: 230 mg/dL — ABNORMAL HIGH (ref 70–99)

## 2023-09-24 NOTE — ED Notes (Signed)
 Patient successfully drank fluids.

## 2023-09-24 NOTE — Discharge Instructions (Addendum)
 You were seen today for heat exposure and concerns for dehydration.  You were able to orally hydrate without difficulty.  Your blood sugar is elevated.  Make sure that you are drinking plenty of electrolyte rich fluids and stay hydrated.  Evaporative cooling is most effective to help keep you cool during the day.  Using a spray bottle and fan can be quite helpful.  See resources provided.

## 2023-09-24 NOTE — ED Provider Notes (Signed)
 [place] EMERGENCY DEPARTMENT AT [facility] Provider Note   CSN: [id number] Arrival date & time: 09/23/23  2248     Patient presents with: Heat Exhaustion  (Homeless)   Casey Adams is a 48 y.o. female.   HPI     This a 48 year old female who presents concerns for dehydration.  Patient reports that yesterday she lost the hotel room that she was staying in and was out in the heat most of the day.  She is currently homeless.  She states that she feels generally fatigued and not myself.  Also reports some dehydration.  She reports generalized weakness.  No chest pain, shortness of breath, abdominal pain.  Prior to Admission medications   Medication Sig Start Date End Date Taking? Authorizing Provider  amoxicillin -clavulanate (AUGMENTIN ) 875-125 MG tablet Take 1 tablet by mouth every 12 (twelve) hours. 12/03/22   Small, Brooke L, PA  Blood Glucose Monitoring Suppl DEVI 1 each by Does not apply route in the morning, at noon, and at bedtime. May substitute to any manufacturer covered by patient's insurance. 08/29/23   Sofia, Leslie K, PA-C  cyclobenzaprine  (FLEXERIL ) 5 MG tablet Take 1 tablet (5 mg total) by mouth 3 (three) times daily as needed for muscle spasms. 12/03/22   Small, Brooke L, PA  doxycycline  (VIBRAMYCIN ) 100 MG capsule Take 1 capsule (100 mg total) by mouth 2 (two) times daily. 12/03/22   Small, Brooke L, PA  Glucose Blood (BLOOD GLUCOSE TEST STRIPS) STRP 1 each by In Vitro route in the morning, at noon, and at bedtime. May substitute to any manufacturer covered by patient's insurance. 08/29/23 09/28/23  Sofia, Leslie K, PA-C  hydrOXYzine  (ATARAX ) 25 MG tablet Take 1 tablet (25 mg total) by mouth 3 (three) times daily as needed. 08/29/23   Sofia, Leslie K, PA-C  ibuprofen  (ADVIL ) 800 MG tablet Take 1 tablet (800 mg total) by mouth every 8 (eight) hours as needed. 12/03/22   Small, Lyle CROME, PA  Lancet Device MISC 1 each by Does not apply route in the morning, at noon, and  at bedtime. May substitute to any manufacturer covered by patient's insurance. 08/29/23 09/28/23  [place] Sonny POUR, PA-C  Lancets Misc. MISC 1 each by Does not apply route in the morning, at noon, and at bedtime. May substitute to any manufacturer covered by patient's insurance. 08/29/23 09/28/23  Sofia, Leslie K, PA-C  ondansetron  (ZOFRAN  ODT) 4 MG disintegrating tablet Take 1 tablet (4 mg total) by mouth every 6 (six) hours as needed. Patient not taking: Reported on 04/26/2018 04/11/18   Ward, Josette SAILOR, DO  ondansetron  (ZOFRAN ) 4 MG tablet Take 1 tablet (4 mg total) by mouth every 6 (six) hours as needed for nausea or vomiting. 04/27/18   Loetta Senior, MD  predniSONE  (DELTASONE ) 10 MG tablet 6,6,5,5,4,4,3,3,2,2,1,1 taper 08/29/23   [place] Sonny POUR, PA-C  traMADol  (ULTRAM ) 50 MG tablet Take 1 tablet (50 mg total) by mouth every 6 (six) hours as needed. 04/27/18   Loetta Senior, MD    Allergies: Patient has no known allergies.    Review of Systems  Constitutional:  Positive for fatigue. Negative for fever.  Respiratory:  Negative for shortness of breath.   Cardiovascular:  Negative for chest pain.  Neurological:  Positive for weakness.  All other systems reviewed and are negative.   Updated Vital Signs BP (!) 163/81 (BP Location: Right Arm)   Pulse 87   Temp 97.7 F (36.5 C) (Oral)   Resp 18  SpO2 100%   Physical Exam Vitals and nursing note reviewed.  Constitutional:      Appearance: She is well-developed. She is obese. She is not ill-appearing.  HENT:     Head: Normocephalic and atraumatic.     Mouth/Throat:     Mouth: Mucous membranes are dry.   Eyes:     Pupils: Pupils are equal, round, and reactive to light.    Cardiovascular:     Rate and Rhythm: Normal rate and regular rhythm.     Heart sounds: Normal heart sounds.  Pulmonary:     Effort: Pulmonary effort is normal. No respiratory distress.     Breath sounds: No wheezing.  Abdominal:     General: Bowel sounds are  normal.     Palpations: Abdomen is soft.     Tenderness: There is no abdominal tenderness.   Musculoskeletal:     Cervical back: Neck supple.   Skin:    General: Skin is warm and dry.   Neurological:     Mental Status: She is alert and oriented to person, place, and time.   Psychiatric:        Mood and Affect: Mood normal.     (all labs ordered are listed, but only abnormal results are displayed) Labs Reviewed - No data to display  EKG: EKG Interpretation Date/Time:  Thursday September 24 2023 06:15:35 EDT Ventricular Rate:  81 PR Interval:  180 QRS Duration:  80 QT Interval:  390 QTC Calculation: 453 R Axis:   24  Text Interpretation: Sinus rhythm Left ventricular hypertrophy Anterior Q waves, possibly due to LVH Confirmed by Bari Pfeiffer ([id number]) on 09/24/2023 6:21:11 AM  Radiology: No results found.   Procedures   Medications Ordered in the ED - No data to display                                  Medical Decision Making  This patient presents to the ED for concern of heat exposure, this involves an extensive number of treatment options, and is a complaint that carries with it a high risk of complications and morbidity.  I considered the following differential and admission for this acute, potentially life threatening condition.  The differential diagnosis includes exhaustion, heatstroke, dehydration  MDM:    This is a 48 year old female who presents with generalized weakness and concerns for dehydration.  Does not currently have a stable housing situation and was in the heat yesterday.  She is awake, alert, oriented.  Vital signs notable for blood pressure 163/81.  EKG without acute ischemic or arrhythmic changes.  Patient was allowed to orally hydrate.  CBG not concerning.  Suspect that this is related to heat exposure.  Do not feel she needs advanced testing as she is able to orally hydrate without difficulty.  (Labs, imaging, consults)  Labs: I Ordered, and  personally interpreted labs.  The pertinent results include: CBG  Imaging Studies ordered: I ordered imaging studies including none I independently visualized and interpreted imaging. I agree with the radiologist interpretation  Additional history obtained from chart review.  External records from outside source obtained and reviewed including prior evaluations  Cardiac Monitoring: The patient was maintained on a cardiac monitor.  If on the cardiac monitor, I personally viewed and interpreted the cardiac monitored which showed an underlying rhythm of: Sinus rhythm  Reevaluation: After the interventions noted above, I reevaluated the patient and found that they  have :improved  Social Determinants of Health:  lives independently  Disposition: Discharge  Co morbidities that complicate the patient evaluation  Past Medical History:  Diagnosis Date   Abnormal Pap smear    2010   Bilateral ovarian cysts    Bipolar 1 disorder (HCC)    History of stomach ulcers    Hypertension      Medicines No orders of the defined types were placed in this encounter.   I have reviewed the patients home medicines and have made adjustments as needed  Problem List / ED Course: Problem List Items Addressed This Visit   None Visit Diagnoses       Heat exposure, initial encounter    -  Primary                Final diagnoses:  Heat exposure, initial encounter    ED Discharge Orders     None          Darek Eifler, Charmaine FALCON, MD 09/24/23 [phone]

## 2023-09-29 DIAGNOSIS — F321 Major depressive disorder, single episode, moderate: Secondary | ICD-10-CM | POA: Diagnosis not present

## 2023-10-06 DIAGNOSIS — E119 Type 2 diabetes mellitus without complications: Secondary | ICD-10-CM | POA: Diagnosis not present

## 2023-10-06 DIAGNOSIS — L239 Allergic contact dermatitis, unspecified cause: Secondary | ICD-10-CM | POA: Diagnosis not present

## 2023-10-06 DIAGNOSIS — B354 Tinea corporis: Secondary | ICD-10-CM | POA: Diagnosis not present

## 2023-10-06 DIAGNOSIS — L299 Pruritus, unspecified: Secondary | ICD-10-CM | POA: Diagnosis not present

## 2023-10-15 DIAGNOSIS — F321 Major depressive disorder, single episode, moderate: Secondary | ICD-10-CM | POA: Diagnosis not present

## 2023-10-19 DIAGNOSIS — F321 Major depressive disorder, single episode, moderate: Secondary | ICD-10-CM | POA: Diagnosis not present

## 2023-10-22 DIAGNOSIS — L299 Pruritus, unspecified: Secondary | ICD-10-CM | POA: Diagnosis not present

## 2023-10-22 DIAGNOSIS — B354 Tinea corporis: Secondary | ICD-10-CM | POA: Diagnosis not present

## 2023-10-22 DIAGNOSIS — R600 Localized edema: Secondary | ICD-10-CM | POA: Diagnosis not present

## 2023-10-22 DIAGNOSIS — L239 Allergic contact dermatitis, unspecified cause: Secondary | ICD-10-CM | POA: Diagnosis not present

## 2023-10-22 DIAGNOSIS — S81819A Laceration without foreign body, unspecified lower leg, initial encounter: Secondary | ICD-10-CM | POA: Diagnosis not present

## 2023-10-26 DIAGNOSIS — F321 Major depressive disorder, single episode, moderate: Secondary | ICD-10-CM | POA: Diagnosis not present

## 2023-10-27 DIAGNOSIS — F321 Major depressive disorder, single episode, moderate: Secondary | ICD-10-CM | POA: Diagnosis not present

## 2023-11-02 DIAGNOSIS — F321 Major depressive disorder, single episode, moderate: Secondary | ICD-10-CM | POA: Diagnosis not present

## 2023-11-03 DIAGNOSIS — F321 Major depressive disorder, single episode, moderate: Secondary | ICD-10-CM | POA: Diagnosis not present

## 2023-11-09 DIAGNOSIS — F321 Major depressive disorder, single episode, moderate: Secondary | ICD-10-CM | POA: Diagnosis not present

## 2023-11-21 DIAGNOSIS — F321 Major depressive disorder, single episode, moderate: Secondary | ICD-10-CM | POA: Diagnosis not present

## 2023-11-22 DIAGNOSIS — F321 Major depressive disorder, single episode, moderate: Secondary | ICD-10-CM | POA: Diagnosis not present

## 2023-11-30 DIAGNOSIS — F321 Major depressive disorder, single episode, moderate: Secondary | ICD-10-CM | POA: Diagnosis not present

## 2023-12-02 DIAGNOSIS — M25352 Other instability, left hip: Secondary | ICD-10-CM | POA: Diagnosis not present

## 2023-12-02 DIAGNOSIS — I10 Essential (primary) hypertension: Secondary | ICD-10-CM | POA: Diagnosis not present

## 2023-12-02 DIAGNOSIS — M25552 Pain in left hip: Secondary | ICD-10-CM | POA: Diagnosis not present

## 2023-12-02 DIAGNOSIS — F321 Major depressive disorder, single episode, moderate: Secondary | ICD-10-CM | POA: Diagnosis not present

## 2023-12-09 DIAGNOSIS — Z1211 Encounter for screening for malignant neoplasm of colon: Secondary | ICD-10-CM | POA: Diagnosis not present

## 2023-12-09 DIAGNOSIS — Z1231 Encounter for screening mammogram for malignant neoplasm of breast: Secondary | ICD-10-CM | POA: Diagnosis not present

## 2023-12-09 DIAGNOSIS — M25352 Other instability, left hip: Secondary | ICD-10-CM | POA: Diagnosis not present

## 2023-12-09 DIAGNOSIS — M25552 Pain in left hip: Secondary | ICD-10-CM | POA: Diagnosis not present

## 2023-12-09 DIAGNOSIS — I10 Essential (primary) hypertension: Secondary | ICD-10-CM | POA: Diagnosis not present

## 2023-12-09 DIAGNOSIS — Z1339 Encounter for screening examination for other mental health and behavioral disorders: Secondary | ICD-10-CM | POA: Diagnosis not present

## 2023-12-09 DIAGNOSIS — Z1331 Encounter for screening for depression: Secondary | ICD-10-CM | POA: Diagnosis not present

## 2024-02-08 DIAGNOSIS — Z131 Encounter for screening for diabetes mellitus: Secondary | ICD-10-CM | POA: Diagnosis not present

## 2024-02-08 DIAGNOSIS — R0789 Other chest pain: Secondary | ICD-10-CM | POA: Diagnosis not present

## 2024-02-08 DIAGNOSIS — Z1339 Encounter for screening examination for other mental health and behavioral disorders: Secondary | ICD-10-CM | POA: Diagnosis not present

## 2024-02-08 DIAGNOSIS — I10 Essential (primary) hypertension: Secondary | ICD-10-CM | POA: Diagnosis not present

## 2024-02-08 DIAGNOSIS — R0602 Shortness of breath: Secondary | ICD-10-CM | POA: Diagnosis not present

## 2024-02-08 DIAGNOSIS — Z1331 Encounter for screening for depression: Secondary | ICD-10-CM | POA: Diagnosis not present

## 2024-02-08 DIAGNOSIS — R5383 Other fatigue: Secondary | ICD-10-CM | POA: Diagnosis not present

## 2024-02-11 DIAGNOSIS — M25552 Pain in left hip: Secondary | ICD-10-CM | POA: Diagnosis not present

## 2024-02-11 DIAGNOSIS — M25551 Pain in right hip: Secondary | ICD-10-CM | POA: Diagnosis not present

## 2024-02-11 DIAGNOSIS — Z1231 Encounter for screening mammogram for malignant neoplasm of breast: Secondary | ICD-10-CM | POA: Diagnosis not present

## 2024-02-11 DIAGNOSIS — I10 Essential (primary) hypertension: Secondary | ICD-10-CM | POA: Diagnosis not present

## 2024-02-11 DIAGNOSIS — E119 Type 2 diabetes mellitus without complications: Secondary | ICD-10-CM | POA: Diagnosis not present

## 2024-02-22 DIAGNOSIS — E559 Vitamin D deficiency, unspecified: Secondary | ICD-10-CM | POA: Diagnosis not present

## 2024-02-22 DIAGNOSIS — Z87891 Personal history of nicotine dependence: Secondary | ICD-10-CM | POA: Diagnosis not present

## 2024-02-22 DIAGNOSIS — M25552 Pain in left hip: Secondary | ICD-10-CM | POA: Diagnosis not present

## 2024-02-22 DIAGNOSIS — I10 Essential (primary) hypertension: Secondary | ICD-10-CM | POA: Diagnosis not present

## 2024-02-22 DIAGNOSIS — E119 Type 2 diabetes mellitus without complications: Secondary | ICD-10-CM | POA: Diagnosis not present

## 2024-02-22 DIAGNOSIS — E782 Mixed hyperlipidemia: Secondary | ICD-10-CM | POA: Diagnosis not present

## 2024-02-22 DIAGNOSIS — Z6834 Body mass index (BMI) 34.0-34.9, adult: Secondary | ICD-10-CM | POA: Diagnosis not present

## 2024-02-22 DIAGNOSIS — K219 Gastro-esophageal reflux disease without esophagitis: Secondary | ICD-10-CM | POA: Diagnosis not present

## 2024-02-22 DIAGNOSIS — M25551 Pain in right hip: Secondary | ICD-10-CM | POA: Diagnosis not present

## 2024-03-11 DIAGNOSIS — E559 Vitamin D deficiency, unspecified: Secondary | ICD-10-CM | POA: Diagnosis not present

## 2024-03-11 DIAGNOSIS — E119 Type 2 diabetes mellitus without complications: Secondary | ICD-10-CM | POA: Diagnosis not present

## 2024-03-11 DIAGNOSIS — E6609 Other obesity due to excess calories: Secondary | ICD-10-CM | POA: Diagnosis not present

## 2024-03-11 DIAGNOSIS — I10 Essential (primary) hypertension: Secondary | ICD-10-CM | POA: Diagnosis not present

## 2024-03-11 DIAGNOSIS — M25552 Pain in left hip: Secondary | ICD-10-CM | POA: Diagnosis not present

## 2024-03-11 DIAGNOSIS — M25551 Pain in right hip: Secondary | ICD-10-CM | POA: Diagnosis not present

## 2024-03-11 DIAGNOSIS — E782 Mixed hyperlipidemia: Secondary | ICD-10-CM | POA: Diagnosis not present

## 2024-03-11 DIAGNOSIS — Z79899 Other long term (current) drug therapy: Secondary | ICD-10-CM | POA: Diagnosis not present

## 2024-03-11 DIAGNOSIS — K219 Gastro-esophageal reflux disease without esophagitis: Secondary | ICD-10-CM | POA: Diagnosis not present

## 2024-03-14 DIAGNOSIS — M25552 Pain in left hip: Secondary | ICD-10-CM | POA: Diagnosis not present

## 2024-03-14 DIAGNOSIS — G8929 Other chronic pain: Secondary | ICD-10-CM | POA: Diagnosis not present

## 2024-03-14 DIAGNOSIS — M25551 Pain in right hip: Secondary | ICD-10-CM | POA: Diagnosis not present
